# Patient Record
Sex: Male | Born: 1980
Health system: Southern US, Community
[De-identification: ages and names within clinical notes are randomized; demographics above are authoritative.]

## PROBLEM LIST (undated history)

## (undated) DIAGNOSIS — R002 Palpitations: Secondary | ICD-10-CM

## (undated) HISTORY — PX: OTHER SURGICAL HISTORY: SHX169

## (undated) HISTORY — DX: Palpitations: R00.2

## (undated) HISTORY — PX: ANTERIOR CRUCIATE LIGAMENT REPAIR: SHX115

---

## 2016-01-22 ENCOUNTER — Emergency Department (HOSPITAL_COMMUNITY): Payer: BLUE CROSS/BLUE SHIELD

## 2016-01-22 ENCOUNTER — Encounter (HOSPITAL_COMMUNITY): Payer: Self-pay | Admitting: Emergency Medicine

## 2016-01-22 ENCOUNTER — Emergency Department (HOSPITAL_COMMUNITY)
Admission: EM | Admit: 2016-01-22 | Discharge: 2016-01-22 | Disposition: A | Discharge status: Home / Self Care | Payer: BLUE CROSS/BLUE SHIELD | Attending: Emergency Medicine | Admitting: Emergency Medicine

## 2016-01-22 DIAGNOSIS — R42 Dizziness and giddiness: Secondary | ICD-10-CM | POA: Diagnosis not present

## 2016-01-22 DIAGNOSIS — R079 Chest pain, unspecified: Secondary | ICD-10-CM | POA: Diagnosis not present

## 2016-01-22 DIAGNOSIS — Z87891 Personal history of nicotine dependence: Secondary | ICD-10-CM | POA: Insufficient documentation

## 2016-01-22 DIAGNOSIS — R0789 Other chest pain: Secondary | ICD-10-CM | POA: Diagnosis not present

## 2016-01-22 LAB — I-STAT CHEM 8, ED
BUN: 10 mg/dL (ref 6–20)
Calcium, Ion: 1.12 mmol/L — ABNORMAL LOW (ref 1.15–1.40)
Chloride: 100 mmol/L — ABNORMAL LOW (ref 101–111)
Creatinine, Ser: 0.9 mg/dL (ref 0.61–1.24)
Glucose, Bld: 144 mg/dL — ABNORMAL HIGH (ref 65–99)
HCT: 41 % (ref 39.0–52.0)
Hemoglobin: 13.9 g/dL (ref 13.0–17.0)
Potassium: 3.8 mmol/L (ref 3.5–5.1)
Sodium: 140 mmol/L (ref 135–145)
TCO2: 27 mmol/L (ref 0–100)

## 2016-01-22 LAB — I-STAT TROPONIN, ED
Troponin i, poc: 0 ng/mL (ref 0.00–0.08)
Troponin i, poc: 0 ng/mL (ref 0.00–0.08)

## 2016-01-22 NOTE — ED Triage Notes (Addendum)
Per [facility], Pt c/o left chest pain onset this am and has been off and on all day.  EMS gave pt NTG SL x's 2 with relief of pain. Pt was also given ASA 324mg .  Pt denies any shortness of breath, diaphoresis or nausea.

## 2016-01-22 NOTE — ED Notes (Signed)
Pt reports having upper chest pain that started this am. Pt states his chest feels heavy and it is hard to breathe. Pt reports dizziness, light-headedness and also  numbness to his Left arm. Pt states his pain is not really there now rating 1/10.

## 2016-01-22 NOTE — ED Provider Notes (Signed)
[facility]EMER[facility]ENCY DEPT Provider Note   CSN: [id number] Arrival date & time: 01/22/16  1542     History   Chief Complaint Chief Complaint  Patient presents with  . Chest Pain    HPI Bonnye Favaiman Budden is a 35 y.o. male with a PMH of obesity presenting to the ED with chest pain that started this afternoon. He states that over the last ten days, he has noticed that his heart has been intermittently "beating fast and hard" when he is laying in bed at night. This morning, he went for a walk before work and felt dizzy. He then ate breakfast and started feeling normal again. At 1:30PM this afternoon, he was walking at work when he started feeling dizzy again. He then noticed chest pain and feeling like his heart was beating hard. The pain is located across his entire chest. The pain "feels like someone is sitting on his chest". The pain does not radiate. Nothing makes the pain worse. En route to the ED, he was given Nitroglycerin x 2 and ASA 324mg , which stopped his chest pain. He endorses shortness of breath that occurred with the chest pain. He denies any nausea, vomiting, or diaphoresis. He has not been under stress recently. He has never had a panic attack before. He has not had any recent surgeries or prolonged car rides.  HPI  History reviewed. No pertinent past medical history.  There are no active problems to display for this patient.   History reviewed. No pertinent surgical history.    Home Medications    Prior to Admission medications   Medication Sig Start Date End Date Taking? Authorizing Provider  ibuprofen (ADVIL,MOTRIN) 200 MG tablet Take 200 mg by mouth every 6 (six) hours as needed for moderate pain.   Yes Historical Provider, MD    Family History No family history of DVT/PE or MI at an early age.  Social History Social History  Substance Use Topics  . Smoking status: Former Games developermoker  . Smokeless tobacco: Never Used  . Alcohol use No     Allergies   Patient has no  known allergies.   Review of Systems Review of Systems 10 Systems reviewed and are negative for acute change except as noted in the HPI.  Physical Exam Updated Vital Signs BP 117/85   Pulse 79   Temp 98.7 F (37.1 C) (Oral)   Resp 15   Ht 5\' 8"  (1.727 m)   Wt 97.5 kg   SpO2 100%   BMI 32.69 kg/m   Physical Exam  Constitutional: He is oriented to person, place, and time. He appears well-developed and well-nourished.  HENT:  Head: Normocephalic and atraumatic.  Mouth/Throat: Oropharynx is clear and moist.  Eyes: Conjunctivae are normal.  Neck: Neck supple. No JVD present. No thyromegaly present.  Cardiovascular: Normal rate and regular rhythm.   No murmur heard. Pulmonary/Chest: Effort normal and breath sounds normal. No respiratory distress. He has no wheezes. He exhibits tenderness.  Abdominal: Soft. Bowel sounds are normal. He exhibits no distension. There is no tenderness. There is no rebound and no guarding.  Musculoskeletal: Normal range of motion. He exhibits no edema.  Neurological: He is alert and oriented to person, place, and time. No cranial nerve deficit.  Skin: Skin is warm and dry. Capillary refill takes less than 2 seconds.  Psychiatric: He has a normal mood and affect. His behavior is normal. Thought content normal.  Nursing note and vitals reviewed.    ED Treatments / Results  Labs (all labs ordered are listed, but only abnormal results are displayed) Labs Reviewed  I-STAT CHEM 8, ED - Abnormal; Notable for the following:       Result Value   Chloride 100 (*)    Glucose, Bld 144 (*)    Calcium, Ion 1.12 (*)    All other components within normal limits  Rosezena SensorI-STAT TROPOININ, ED  Rosezena SensorI-STAT TROPOININ, ED    EKG  EKG Interpretation  Date/Time:  Wednesday January 22 2016 15:55:14 EST Ventricular Rate:  87 PR Interval:    QRS Duration: 91 QT Interval:  361 QTC Calculation: 435 R Axis:   69 Text Interpretation:  Sinus rhythm No old tracing to compare  Confirmed by [facility]  MD, ELLIOTT [phone]([id number]) on 01/22/2016 4:02:17 PM       Radiology Dg Chest 2 View  Result Date: 01/22/2016 CLINICAL DATA:  Lightheadedness, LEFT arm numbness EXAM: CHEST  2 VIEW COMPARISON:  None. FINDINGS: Normal mediastinum and cardiac silhouette. Normal pulmonary vasculature. No evidence of effusion, infiltrate, or pneumothorax. No acute bony abnormality. Low lung volumes IMPRESSION: Low lung volumes.  No acute cardiopulmonary process. Electronically Signed   By: Genevive BiStewart  Edmunds M.D.   On: 01/22/2016 17:23    Procedures Procedures (including critical care time)  Medications Ordered in ED Medications - No data to display   Initial Impression / Assessment and Plan / ED Course  I have reviewed the triage vital signs and the nursing notes.  Pertinent labs & imaging results that were available during my care of the patient were reviewed by me and considered in my medical decision making (see chart for details).  Clinical Course    [name] is a 35 year old male with a PMH of obesity who presents to the ED with chest pain. He does have some typical features including chest pain that resolved with nitroglycerin and chest pain associated with dizziness and shortness of breath. He also has some atypical features, including chest pain that is reproducible on palpation of the sternum. Think ACS is unlikely in this 35 year old otherwise healthy male. He does not have a family history of heart attacks at an early age. He does have some risk factors including obesity. Heart score is a 2. Think this is more likely musculoskeletal (given that his chest pain is reproducible on palpation of the sternum) vs panic attack. Do not think this is a PE, as Pt does not have any risk factors and is PERC negative. Will get basic lab work and imaging including I-stat chem 8, I-stat trop, EKG, and CXR.  4:15PM: EKG with normal sinus rhythm.  5:30PM: I-stat trop 0.00, I-stat chem 8 unremarkable except  for glucose of 144, CXR negative. Will obtain another troponin, because his chest pain started at 1:30PM.  6:30PM: Repeat I-stat trop was 0.00. Pt is safe for discharge home.  Final Clinical Impressions(s) / ED Diagnoses   Final diagnoses:  Chest wall pain    New Prescriptions Current Discharge Medication List       Campbell StallKaty Dodd Mayo, MD 01/22/16 1830    Tilden FossaElizabeth Rees, MD 01/24/16 [phone]

## 2016-02-06 ENCOUNTER — Ambulatory Visit (INDEPENDENT_AMBULATORY_CARE_PROVIDER_SITE_OTHER): Payer: BLUE CROSS/BLUE SHIELD | Admitting: Family Medicine

## 2016-02-06 ENCOUNTER — Encounter: Payer: Self-pay | Admitting: Family Medicine

## 2016-02-06 VITALS — BP 124/82 | HR 85 | Temp 98.4°F | Resp 12 | Wt 217.9 lb

## 2016-02-06 DIAGNOSIS — R0789 Other chest pain: Secondary | ICD-10-CM | POA: Diagnosis not present

## 2016-02-06 DIAGNOSIS — Z6833 Body mass index (BMI) 33.0-33.9, adult: Secondary | ICD-10-CM | POA: Diagnosis not present

## 2016-02-06 DIAGNOSIS — R739 Hyperglycemia, unspecified: Secondary | ICD-10-CM

## 2016-02-06 DIAGNOSIS — R002 Palpitations: Secondary | ICD-10-CM | POA: Diagnosis not present

## 2016-02-06 NOTE — Progress Notes (Signed)
HPI:   [name] is a 36 y.o. male, who is here today to establish care with me.  Former PCP: N/A, he was following with provider at school. Last preventive routine visit: 1-2 years ago.   Concerns today: ER follow up.  He presented to the ER on 01/22/16 due to chest pain and LUE numbness. Work-up otherwise negative. He takes Ibuprofen as needed.   Lab Results  Component Value Date   CREATININE 0.90 01/22/2016   BUN 10 01/22/2016   NA 140 01/22/2016   K 3.8 01/22/2016   CL 100 (L) 01/22/2016   Glucose 144, not fating, no Hx of diabetes.   Pain has improved, almost resolved. Numbness resolved.  It is not exacerbated by exertion but when lying down, felt like he could not breath deep. He was feeling some palpitations, also at rest, and resolved.  + Pressure like, constant, no associated dyspnea,or diaphoresis.  He has not noted fever,chills, or skin rash. No Hx of trauma. He denies Hx of anxiety.    CXR : Low volumes, negative otherwise.  He usually exercises regularly, has not done so since ER visit. He denies chest pain or dyspnea with exercise. He is trying to follow a healthy diet and has noted some wt loss.    Review of Systems  Constitutional: Negative for activity change, appetite change, fatigue, fever and unexpected weight change.  HENT: Negative for mouth sores, nosebleeds, sore throat and trouble swallowing.   Respiratory: Negative for cough, shortness of breath and wheezing.   Cardiovascular: Negative for palpitations and leg swelling.  Gastrointestinal: Negative for abdominal pain, nausea and vomiting.  Genitourinary: Negative for decreased urine volume and hematuria.  Musculoskeletal: Negative for back pain and myalgias.  Skin: Negative for rash.  Neurological: Negative for syncope, weakness, numbness and headaches.  Hematological: Negative for adenopathy. Does not bruise/bleed easily.  Psychiatric/Behavioral: Negative for  confusion. The patient is not nervous/anxious.       Current Outpatient Prescriptions on File Prior to Visit  Medication Sig Dispense Refill  . ibuprofen (ADVIL,MOTRIN) 200 MG tablet Take 200 mg by mouth every 6 (six) hours as needed for moderate pain.     No current facility-administered medications on file prior to visit.      Past Medical History:  Diagnosis Date  . Allergy    No Known Allergies  Family History  Problem Relation Age of Onset  . Cancer Mother   . Heart disease Maternal Aunt   . Hypertension Maternal Aunt   . Diabetes Paternal Grandfather     Social History   Social History  . Marital status: Single    Spouse name: N/A  . Number of children: N/A  . Years of education: N/A   Social History Main Topics  . Smoking status: Former Games developermoker  . Smokeless tobacco: Never Used  . Alcohol use No  . Drug use: No  . Sexual activity: Yes   Other Topics Concern  . None   Social History Narrative  . None    Vitals:   02/06/16 1400  BP: 124/82  Pulse: 85  Resp: 12  Temp: 98.4 F (36.9 C)   O2 sat 98% at RA.  Body mass index is 33.13 kg/m.   Physical Exam  Nursing note and vitals reviewed. Constitutional: He is oriented to person, place, and time. He appears well-developed. No distress.  HENT:  Head: Atraumatic.  Mouth/Throat: Oropharynx is clear and moist and mucous membranes are normal.  Eyes: Conjunctivae and EOM are normal. Pupils are equal, round, and reactive to light.  Neck: No thyroid mass and no thyromegaly present.  Cardiovascular: Normal rate and regular rhythm.   No murmur heard. Pulses:      Dorsalis pedis pulses are 2+ on the right side, and 2+ on the left side.  Respiratory: Effort normal and breath sounds normal. No respiratory distress. He exhibits no tenderness.  GI: Soft. He exhibits no mass. There is no hepatomegaly. There is no tenderness.  Musculoskeletal: He exhibits no edema.  Lymphadenopathy:    He has no cervical  adenopathy.  Neurological: He is alert and oriented to person, place, and time. He has normal strength. Coordination and gait normal.  Skin: Skin is warm. No erythema.  Psychiatric: He has a normal mood and affect.  Well groomed, good eye contact.      ASSESSMENT AND PLAN:     Dave Luna was seen today for new patient (initial visit).  Diagnoses and all orders for this visit:   Lab Results  Component Value Date   TSH 1.53 02/07/2016   Lab Results  Component Value Date   CREATININE 0.99 02/07/2016   BUN 14 02/07/2016   NA 138 02/07/2016   K 4.3 02/07/2016   CL 102 02/07/2016   CO2 29 02/07/2016    Chest wall pain  Improved. Possible causes discussed. Instructed about warning signs. Continue Ibuprofen OTC as needed.  Hyperglycemia  Further recommendations will be given according to lab results.   -     Basic metabolic panel; Future  Palpitations  Resolved. Instructed about warning signs. further recommendations will be given according to lab results.  -     TSH; Future   BMI 33.0-33.9,adult  We discussed benefits of wt loss as well as adverse effects of obesity. Consistency with healthy diet and physical activity recommended.   -He is not fasting today, so will come back tomorrow morning for lab work.   Dave Luna G. [place], MD  [facility]. [place] office.

## 2016-02-06 NOTE — Patient Instructions (Signed)
A few things to remember from today's visit:   Hyperglycemia - Plan: Basic metabolic panel  Palpitations - Plan: TSH  Chest wall pain   We have ordered labs or studies at this visit.  It can take up to 1-2 weeks for results and processing. IF results require follow up or explanation, we will call you with instructions. Clinically stable results will be released to your [facility]. If you have not heard from us or cannot find your results in [facility] in 2 weeks please contact our office at [phone].  If you are not yet signed up for [facility], please consider signing up   Please be sure medication list is accurate. If a new problem present, please set up appointment sooner than planned today.

## 2016-02-06 NOTE — Progress Notes (Signed)
Pre visit review using our clinic review tool, if applicable. No additional management support is needed unless otherwise documented below in the visit note. 

## 2016-02-07 ENCOUNTER — Other Ambulatory Visit (INDEPENDENT_AMBULATORY_CARE_PROVIDER_SITE_OTHER): Payer: BLUE CROSS/BLUE SHIELD

## 2016-02-07 ENCOUNTER — Encounter: Payer: Self-pay | Admitting: Family Medicine

## 2016-02-07 DIAGNOSIS — R002 Palpitations: Secondary | ICD-10-CM | POA: Diagnosis not present

## 2016-02-07 DIAGNOSIS — R739 Hyperglycemia, unspecified: Secondary | ICD-10-CM | POA: Diagnosis not present

## 2016-02-07 LAB — TSH: TSH: 1.53 u[IU]/mL (ref 0.35–4.50)

## 2016-02-07 LAB — BASIC METABOLIC PANEL
BUN: 14 mg/dL (ref 6–23)
CO2: 29 mEq/L (ref 19–32)
Calcium: 10 mg/dL (ref 8.4–10.5)
Chloride: 102 mEq/L (ref 96–112)
Creatinine, Ser: 0.99 mg/dL (ref 0.40–1.50)
GFR: 91.74 mL/min (ref >60.00)
Glucose, Bld: 95 mg/dL (ref 70–99)
Potassium: 4.3 mEq/L (ref 3.5–5.1)
Sodium: 138 mEq/L (ref 135–145)

## 2016-02-17 ENCOUNTER — Ambulatory Visit (INDEPENDENT_AMBULATORY_CARE_PROVIDER_SITE_OTHER)
Admission: RE | Admit: 2016-02-17 | Discharge: 2016-02-17 | Disposition: A | Discharge status: Home / Self Care | Payer: BLUE CROSS/BLUE SHIELD | Source: Ambulatory Visit | Attending: Family Medicine | Admitting: Family Medicine

## 2016-02-17 ENCOUNTER — Encounter: Payer: Self-pay | Admitting: Family Medicine

## 2016-02-17 ENCOUNTER — Ambulatory Visit (INDEPENDENT_AMBULATORY_CARE_PROVIDER_SITE_OTHER): Payer: BLUE CROSS/BLUE SHIELD | Admitting: Family Medicine

## 2016-02-17 ENCOUNTER — Telehealth: Payer: Self-pay | Admitting: Family Medicine

## 2016-02-17 VITALS — BP 116/80 | HR 110 | Resp 12 | Ht 68.0 in | Wt 215.4 lb

## 2016-02-17 DIAGNOSIS — R0789 Other chest pain: Secondary | ICD-10-CM

## 2016-02-17 DIAGNOSIS — R000 Tachycardia, unspecified: Secondary | ICD-10-CM

## 2016-02-17 DIAGNOSIS — R0600 Dyspnea, unspecified: Secondary | ICD-10-CM

## 2016-02-17 DIAGNOSIS — R079 Chest pain, unspecified: Secondary | ICD-10-CM | POA: Diagnosis not present

## 2016-02-17 MED ORDER — IOPAMIDOL (ISOVUE-370) INJECTION 76%
80.0000 mL | Freq: Once | INTRAVENOUS | Status: AC | PRN
  Administered 2016-02-17: 80 mL via INTRAVENOUS

## 2016-02-17 NOTE — Telephone Encounter (Signed)
TELEPHONE ADVICE RECORD [facility]  Patient Name: Dave Luna  DOB: 04/24/1980    Initial Comment Caller states her nephew is having a fast heartbeat.    Nurse Assessment  Nurse: Odis LusterBowers, RN, Bjorn Loserhonda Date/Time Lamount Cohen(Eastern Time): 02/17/2016 1:45:34 PM  Confirm and document reason for call. If symptomatic, describe symptoms. ---Caller states her nephew is having a fast heartbeat. Caller reports that they are currently at the [facility] to be seen.  Does the patient have any new or worsening symptoms? ---Yes  Will a triage be completed? ---No  Select reason for no triage. ---Other  Please document clinical information provided and list any resource used. ---At [facility] office currently.     Guidelines    Guideline Title Affirmed Question Affirmed Notes       Final Disposition User   Clinical Call Odis LusterBowers, RN, Bjorn Loserhonda

## 2016-02-17 NOTE — Patient Instructions (Signed)
A few things to remember from today's visit:   Other chest pain - Plan: EKG 12-Lead, CT Angio Chest W/Cm &/Or Wo Cm  Dyspnea, unspecified type - Plan: CT Angio Chest W/Cm &/Or Wo Cm  I still feel like it can be muscle related but because sudden onset and recurrent CT is being arranged.   Please be sure medication list is accurate. If a new problem present, please set up appointment sooner than planned today.

## 2016-02-17 NOTE — Progress Notes (Signed)
HPI:  ACUTE VISIT:  Chief Complaint  Patient presents with  . chest wall pain    [name] is a 36 y.o. male, who is here today with his aunt complaining of chest pain. I saw him last on 02/06/16 to follow on recent ER visit for chest pain.  CXR otherwise negative,low lung volumes. Troponin x 2 and EKG negative for acute coronary event. According to pt, he has had 2 episodes of chest pain since his last OV, first one mild, lasted about 2 hours, states that it was relieved by taking Nitroglycerin SL. A second event today morning, started feeling his heart racing, "heavy breathing", and lightheaded then upper chest pain that starts on left shoulder and radiates across chest to right shoulder.Pain is severe, no diaphoresis associated. Pain has improved after taking Ibuprofen a couple hours ago and applying local ice.  According to aunt, she noted dyspnea while she was on the phone with him.  He denies any fever,chills, cough, wheezing, recent travel, or sick contact. No heartburn,abdominal pain, nausea, or vomiting.  He has not identified exacerbating factors. Sometimes chest pain is alleviated by walking outdoors.  He is exercising regularly and denies any chest pain or palpitation while doing so.  He denies anxiety or increased stress. No LE edema or erythema.  Former smoker.  FHx negative for premature CAD.   Review of Systems  Constitutional: Negative for activity change, appetite change, diaphoresis, fatigue and fever.  HENT: Negative for congestion, nosebleeds, sore throat, trouble swallowing and voice change.   Eyes: Negative for pain and visual disturbance.  Respiratory: Positive for shortness of breath. Negative for cough and wheezing.   Cardiovascular: Positive for chest pain and palpitations. Negative for leg swelling.  Gastrointestinal: Negative for abdominal pain, nausea and vomiting.       No changes in bowel habits.  Musculoskeletal: Negative for  back pain and myalgias.  Skin: Negative for color change and rash.  Neurological: Negative for dizziness, syncope, weakness and headaches.  Psychiatric/Behavioral: Negative for confusion and sleep disturbance. The patient is not nervous/anxious.       Current Outpatient Prescriptions on File Prior to Visit  Medication Sig Dispense Refill  . ibuprofen (ADVIL,MOTRIN) 200 MG tablet Take 200 mg by mouth every 6 (six) hours as needed for moderate pain.     No current facility-administered medications on file prior to visit.      Past Medical History:  Diagnosis Date  . Allergy    No Known Allergies  Social History   Social History  . Marital status: Single    Spouse name: N/A  . Number of children: N/A  . Years of education: N/A   Social History Main Topics  . Smoking status: Former Games developer  . Smokeless tobacco: Never Used  . Alcohol use No  . Drug use: No  . Sexual activity: Yes   Other Topics Concern  . None   Social History Narrative  . None    Vitals:   02/17/16 1448  BP: 116/80  Pulse: (!) 110  Resp: 12   O2 sat 97% at RA.  Body mass index is 32.75 kg/m.   Physical Exam  Nursing note and vitals reviewed. Constitutional: He is oriented to person, place, and time. He appears well-developed. No distress.  HENT:  Head: Atraumatic.  Mouth/Throat: Oropharynx is clear and moist and mucous membranes are normal.  Eyes: Conjunctivae and EOM are normal. Pupils are equal, round, and reactive to light.  Neck:  Normal range of motion. No thyroid mass present.  Cardiovascular: Regular rhythm.  Tachycardia present.   No murmur heard. Pulses:      Dorsalis pedis pulses are 2+ on the right side, and 2+ on the left side.  Respiratory: Effort normal and breath sounds normal. No respiratory distress. He exhibits tenderness (mild, bilateral accross upper chest).  GI: Soft. He exhibits no mass. There is no hepatomegaly. There is no tenderness.  Musculoskeletal: He  exhibits no edema or tenderness.       Cervical back: He exhibits no tenderness.       Thoracic back: He exhibits no tenderness.  Lymphadenopathy:    He has no cervical adenopathy.       Right: No supraclavicular adenopathy present.       Left: No supraclavicular adenopathy present.  Neurological: He is alert and oriented to person, place, and time. He has normal strength. Coordination and gait normal.  Skin: Skin is warm. No rash noted. No erythema.  Psychiatric: He has a normal mood and affect. Cognition and memory are normal.  Well groomed, good eye contact.      ASSESSMENT AND PLAN:     Dave Luna was seen today for chest wall pain.  Diagnoses and all orders for this visit:  Other chest pain  We discussed possible causes:cardiac,musculoskeletal,pulmonary. Because reporting sudden onset of upper chest pain, bilateral, associated with dyspnea I recommended chest CTA stat to evaluate for thrombotic event, PE. EKG today SR, normal axis, no acute ischemic changes. No significant changes when compare with the one done 01/23/16.  Even though clinical presentation does not suggest cardiac etiology, cardiology referral can be also considered. Clearly instructed about warning signs.  -     EKG 12-Lead -     CT Angio Chest W/Cm &/Or Wo Cm; Future  Dyspnea, unspecified type  Not exertional and associated with above problem. CXR 12/2015 negative.   -     CT Angio Chest W/Cm &/Or Wo Cm; Future  Sinus tachycardia  Mild. C/O intermittent palpitations at rest.  Lab Results  Component Value Date   TSH 1.53 02/07/2016   Cardiology evaluation to be consider if imaging today is negative and symptoms persist.  ? Anxiety.      -[name] was advised to return or notify a doctor immediately if symptoms worsen or persist or new concerns arise. Further recommendations will be given after chest CT report.       Betty G. [place], MD  [facility]. [place]  office.

## 2016-02-17 NOTE — Progress Notes (Signed)
Pre visit review using our clinic review tool, if applicable. No additional management support is needed unless otherwise documented below in the visit note. 

## 2016-02-17 NOTE — Telephone Encounter (Signed)
Pt is in office currently and waiting to be seen by Dr SwazilandJordan.

## 2016-02-27 ENCOUNTER — Emergency Department (HOSPITAL_COMMUNITY)
Admission: EM | Admit: 2016-02-27 | Discharge: 2016-02-28 | Disposition: A | Discharge status: Home / Self Care | Payer: BLUE CROSS/BLUE SHIELD | Attending: Emergency Medicine | Admitting: Emergency Medicine

## 2016-02-27 ENCOUNTER — Encounter (HOSPITAL_COMMUNITY): Payer: Self-pay | Admitting: Emergency Medicine

## 2016-02-27 ENCOUNTER — Emergency Department (HOSPITAL_COMMUNITY): Payer: BLUE CROSS/BLUE SHIELD

## 2016-02-27 ENCOUNTER — Ambulatory Visit (INDEPENDENT_AMBULATORY_CARE_PROVIDER_SITE_OTHER): Payer: BLUE CROSS/BLUE SHIELD | Admitting: Family Medicine

## 2016-02-27 ENCOUNTER — Encounter: Payer: Self-pay | Admitting: Family Medicine

## 2016-02-27 VITALS — BP 122/82 | HR 80 | Resp 12 | Ht 68.0 in | Wt 214.0 lb

## 2016-02-27 DIAGNOSIS — R42 Dizziness and giddiness: Secondary | ICD-10-CM | POA: Diagnosis present

## 2016-02-27 DIAGNOSIS — R002 Palpitations: Secondary | ICD-10-CM

## 2016-02-27 DIAGNOSIS — R000 Tachycardia, unspecified: Secondary | ICD-10-CM | POA: Diagnosis not present

## 2016-02-27 DIAGNOSIS — J9811 Atelectasis: Secondary | ICD-10-CM | POA: Diagnosis not present

## 2016-02-27 DIAGNOSIS — R55 Syncope and collapse: Secondary | ICD-10-CM | POA: Insufficient documentation

## 2016-02-27 DIAGNOSIS — Z87891 Personal history of nicotine dependence: Secondary | ICD-10-CM | POA: Diagnosis not present

## 2016-02-27 LAB — CBC
HCT: 42.3 % (ref 39.0–52.0)
Hemoglobin: 13.7 g/dL (ref 13.0–17.0)
MCH: 25 pg — ABNORMAL LOW (ref 26.0–34.0)
MCHC: 32.4 g/dL (ref 30.0–36.0)
MCV: 77.2 fL — ABNORMAL LOW (ref 78.0–100.0)
Platelets: 300 10*3/uL (ref 150–400)
RBC: 5.48 MIL/uL (ref 4.22–5.81)
RDW: 13.7 % (ref 11.5–15.5)
WBC: 6.9 10*3/uL (ref 4.0–10.5)

## 2016-02-27 LAB — URINALYSIS, ROUTINE W REFLEX MICROSCOPIC
Bilirubin Urine: NEGATIVE
Glucose, UA: NEGATIVE mg/dL
Hgb urine dipstick: NEGATIVE
Ketones, ur: NEGATIVE mg/dL
Leukocytes, UA: NEGATIVE
Nitrite: NEGATIVE
Protein, ur: NEGATIVE mg/dL
Specific Gravity, Urine: 1.015 (ref 1.005–1.030)
pH: 6 (ref 5.0–8.0)

## 2016-02-27 LAB — CBG MONITORING, ED
Glucose-Capillary: 146 mg/dL — ABNORMAL HIGH (ref 65–99)
Glucose-Capillary: 80 mg/dL (ref 65–99)

## 2016-02-27 LAB — BASIC METABOLIC PANEL
Anion gap: 8 (ref 5–15)
BUN: 17 mg/dL (ref 6–20)
CO2: 28 mmol/L (ref 22–32)
Calcium: 9.5 mg/dL (ref 8.9–10.3)
Chloride: 103 mmol/L (ref 101–111)
Creatinine, Ser: 0.96 mg/dL (ref 0.61–1.24)
GFR calc Af Amer: >60 mL/min (ref >60)
GFR calc non Af Amer: >60 mL/min (ref >60)
Glucose, Bld: 90 mg/dL (ref 65–99)
Potassium: 4 mmol/L (ref 3.5–5.1)
Sodium: 139 mmol/L (ref 135–145)

## 2016-02-27 LAB — I-STAT TROPONIN, ED: Troponin i, poc: 0 ng/mL (ref 0.00–0.08)

## 2016-02-27 MED ORDER — SODIUM CHLORIDE 0.9 % IV BOLUS (SEPSIS): 1000.0000 mL | Freq: Once | INTRAVENOUS | Status: DC

## 2016-02-27 NOTE — Discharge Instructions (Signed)
Get help right away if: °You have a severe headache. °You have unusual pain in your chest, abdomen, or back. °You are bleeding from your mouth or rectum, or you have black or tarry stool. °You have a very fast or irregular heartbeat (palpitations). °You faint once or repeatedly. °You have a seizure. °You are confused. °You have trouble walking. °You have severe weakness. °You have vision problems. °

## 2016-02-27 NOTE — Progress Notes (Signed)
Pre visit review using our clinic review tool, if applicable. No additional management support is needed unless otherwise documented below in the visit note. 

## 2016-02-27 NOTE — ED Provider Notes (Signed)
[facility]-EMERGENCY DEPT Provider Note   CSN: [id number] Arrival date & time: 02/27/16  0141     History   Chief Complaint Chief Complaint  Patient presents with  . Dizziness    HPI Dave Luna is a 36 y.o. male who presents with c/o presyncope. Patient has been seen in the ED previously (01/22/2016) with similar sxs. He has had intermittent episodes of feeling his heart racing and beating hard. This morning, he awoke from sleep around 1:00 AM with diaphoresis, blurry vision, pounding heart, and feeling that he was going to pass out. This episode lasted about 30 min. He denies vertiginous sxs. He was able to get up and Go to the kitchen to eat half a banana and drink some orange juice. He states that he thought maybe his blood sugar was low, although he has no history of diabetes. The patient states that his symptoms resolved. He denies nausea, chest pain,  Patient states that he felt weak in his legs when he walked, but denies unilateral weakness.  HPI  Past Medical History:  Diagnosis Date  . Allergy     Patient Active Problem List   Diagnosis Date Noted  . BMI 33.0-33.9,adult 02/06/2016    History reviewed. No pertinent surgical history.     Home Medications    Prior to Admission medications   Medication Sig Start Date End Date Taking? Authorizing Provider  ibuprofen (ADVIL,MOTRIN) 200 MG tablet Take 200 mg by mouth every 6 (six) hours as needed for moderate pain.   Yes Historical Provider, MD    Family History Family History  Problem Relation Age of Onset  . Cancer Mother   . Heart disease Maternal Aunt   . Hypertension Maternal Aunt   . Diabetes Paternal Grandfather     Social History Social History  Substance Use Topics  . Smoking status: Former Games developermoker  . Smokeless tobacco: Never Used  . Alcohol use No     Allergies   Patient has no known allergies.   Review of Systems Review of Systems Ten systems reviewed and are negative for acute change, except as  noted in the HPI.    Physical Exam Updated Vital Signs BP 104/68   Pulse 71   Temp 97.8 F (36.6 C) (Oral)   Resp 19   Ht 5\' 8"  (1.727 m)   Wt 96.6 kg   SpO2 98%   BMI 32.39 kg/m   Physical Exam Physical Exam  Nursing note and vitals reviewed. Constitutional: He appears well-developed and well-nourished. No distress.  HENT:  Head: Normocephalic and atraumatic.  Eyes: Conjunctivae normal are normal. No scleral icterus.  Neck: Normal range of motion. Neck supple.  Cardiovascular: Normal rate, regular rhythm and normal heart sounds.   Pulmonary/Chest: Effort normal and breath sounds normal. No respiratory distress.  Abdominal: Soft. There is no tenderness.  Musculoskeletal: He exhibits no edema.  Neurological: Speech is clear and goal oriented, follows commands Major Cranial nerves without deficit, no facial droop Normal strength in upper and lower extremities bilaterally including dorsiflexion and plantar flexion, strong and equal grip strength Sensation normal to light and sharp touch Moves extremities without ataxia, coordination intact Normal finger to nose and rapid alternating movements Neg romberg, no pronator drift Normal gait Normal heel-shin and balance Skin: Skin is warm and dry. He is not diaphoretic.  Psychiatric: His behavior is normal.     ED Treatments / Results  Labs (all labs ordered are listed, but only abnormal results are displayed) Labs Reviewed  CBC - Abnormal; Notable for the following:       Result Value   MCV 77.2 (*)    MCH 25.0 (*)    All other components within normal limits  URINALYSIS, ROUTINE W REFLEX MICROSCOPIC - Abnormal; Notable for the following:    Color, Urine STRAW (*)    All other components within normal limits  CBG MONITORING, ED - Abnormal; Notable for the following:    Glucose-Capillary 146 (*)    All other components within normal limits  BASIC METABOLIC PANEL  CBG MONITORING, ED  POCT CBG (FASTING - GLUCOSE)-MANUAL  ENTRY  I-STAT TROPOININ, ED    EKG  EKG Interpretation  Date/Time:  Thursday February 27 2016 04:06:08 EST Ventricular Rate:  71 PR Interval:    QRS Duration: 92 QT Interval:  376 QTC Calculation: 409 R Axis:   53 Text Interpretation:  Sinus rhythm ST elev, probable normal early repol pattern When compared with ECG of EARLIER SAME DATE No significant change was found Confirmed by [facility]  MD, DAVID ([id number]) on 02/27/2016 4:18:18 AM       Radiology Dg Chest 2 View  Result Date: 02/27/2016 CLINICAL DATA:  Intermittent dizziness and lightheadedness for 3 days. EXAM: CHEST  2 VIEW COMPARISON:  CT chest February 17, 2016 FINDINGS: Cardiomediastinal silhouette is normal. No pleural effusions or focal consolidations. Strandy densities LEFT lung base. Trachea projects midline and there is no pneumothorax. Soft tissue planes and included osseous structures are non-suspicious. IMPRESSION: LEFT lung base atelectasis. Electronically Signed   By: Awilda Metro M.D.   On: 02/27/2016 04:15    Procedures Procedures (including critical care time)  Medications Ordered in ED Medications  sodium chloride 0.9 % bolus 1,000 mL (not administered)     Initial Impression / Assessment and Plan / ED Course  I have reviewed the triage vital signs and the nursing notes.  Pertinent labs & imaging results that were available during my care of the patient were reviewed by me and considered in my medical decision making (see chart for details).     Patient with normal ECG. His blood pressures are soft and will give fluids. He has had negative orthostatics. Chest x-ray without significant abnormality. Patient's labs are reassuring. I doubt TIA or other neurologic symptom. I do have concern for an intermittent cardiac arrhythmia. Patient will receive fluids. Feel he is safe to be discharged with outpatient follow-up for Holter monitoring.  Final Clinical Impressions(s) / ED Diagnoses   Final diagnoses:  None      New Prescriptions New Prescriptions   No medications on file     Arthor Captain, PA-C 03/02/16 1554    Dione Booze, MD 03/03/16 [phone]

## 2016-02-27 NOTE — ED Notes (Signed)
Patient d/c;d self care.  F/U  And medications reviewed.  Patient verbalized undertanding.

## 2016-02-27 NOTE — Patient Instructions (Signed)
A few things to remember from today's visit:   Pre-syncope - Plan: Glucose tolerance, 4 hours  Heart palpitations - Plan: Ambulatory referral to Cardiology  Eat small portions of food q 3 hours. Adequate hydration.  At this point I am not certain about what is causing symptoms. Labs and imaging have been otherwise normal.  Test for sugar ordered and cardiology appointment will be arranged.   Please be sure medication list is accurate. If a new problem present, please set up appointment sooner than planned today.

## 2016-02-27 NOTE — ED Notes (Signed)
Pt states that he had an episode of dizziness on Sunday and flt as though he would pass out, and had a similar dizzy spell tonight in where he awoke and was sweating and had non radiating  chest discomfort/palpiations. Chest discomfort lasted for about an hour,and was accompanied by diaphoresis,  SOB, and nausea. Pt states that he did not take any medication for pain. Pt also reports that he was seen by his MD on 12/27 for similar chest discomfort and was dx with Chest wall pain EKG/CT scan were normal as per patient. Pt is accompanied  With Aunt and Cousin. Pt appears calm and is without distress at this time.

## 2016-02-27 NOTE — Progress Notes (Signed)
HPI:   ACUTE VISIT:  No chief complaint on file.   Dave Luna is a 36 y.o. male, who is here today complaining of episodes of palpitations and rapid heart beat, he thinks are caused by low blood sugars. Dave Dave Luna came to the room 10 min into Dave visit, he was agreeable for him to be with him.  I saw him last 02/06/16 to follow on ER visit due to chest pain, chest CT was ordered because persistent symptoms and negative otherwise.    These symptoms started 4 days ago:  On 02/23/16 Woke up feeling clammy, having palpitation, "shaking", and feeling dizzy.  He assumed it was Dave BS,so ate a banana and drank  Juice,15 min later he started feeling better and an hour to be back to Dave baseline.  Today around 1 am woke again sweating ,associated palpitation, legs feeling weak, could not get up and walk, very fatigue. He states that he thought he was going to faint. Dave Dave Luna took him to the ER.  He felt better after drinking juice. Palpitations lasted longer this time.  He has no associated headache during acute episode but has had headache at the end of the day, attributed to poor sleep.  He has been exercising regularly, runs and walks, denies any similar symptom while doing so. No chest pain or dyspnea. He is following a healthy diet, he is not skipping meals. He ate dinner around 6-7 pm the day before these episodes happened. 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  Dave Luna was in the ER earlier today and some labs were done as well as EKG and CXR.  Lab Results  Component Value Date   CREATININE 0.96 02/27/2016   BUN 17 02/27/2016   NA 139 02/27/2016   K 4.0 02/27/2016   CL 103 02/27/2016   CO2 28 02/27/2016   Lab Results  Component Value Date   WBC 6.9 02/27/2016   HGB 13.7 02/27/2016   HCT 42.3 02/27/2016   MCV 77.2 (L) 02/27/2016   PLT 300 02/27/2016    Troponin x 1 WNL. Glucose upon arrival 146, 1-2 hours later 80.  He denies any new stress or feeling anxious.   Review of Systems  Constitutional: Positive for diaphoresis and fatigue. Negative for activity change, appetite change, chills, fever and unexpected weight change.  HENT: Negative for congestion, mouth sores, nosebleeds, sore throat, trouble swallowing and voice change.   Eyes: Negative for pain and visual disturbance.  Respiratory: Negative for apnea, cough, shortness of breath and wheezing.   Cardiovascular: Positive for palpitations. Negative for chest pain and leg swelling.  Gastrointestinal: Negative for abdominal pain, nausea and vomiting.       No changes in bowel habits.  Endocrine: Negative for cold intolerance, heat intolerance, polydipsia, polyphagia and polyuria.  Genitourinary: Negative for decreased urine volume, dysuria, frequency and hematuria.  Musculoskeletal: Negative for back pain, myalgias and neck pain.  Skin: Negative for rash.  Neurological: Positive for light-headedness. Negative for seizures, syncope, weakness, numbness and headaches.  Hematological: Negative for adenopathy. Does not bruise/bleed easily.  Psychiatric/Behavioral: Negative for confusion. The patient is not nervous/anxious.  Current Outpatient Prescriptions on File Prior to Visit  Medication Sig Dispense Refill  . ibuprofen (ADVIL,MOTRIN) 200 MG tablet Take 200 mg by mouth every 6 (six) hours as needed for moderate pain.     No current facility-administered medications on file prior to visit.      Past Medical History:  Diagnosis Date  . Allergy    No Known Allergies  Social History   Social History  . Marital status: Single    Spouse name: N/A  . Number of children: N/A  . Years of  education: N/A   Social History Main Topics  . Smoking status: Former Games developermoker  . Smokeless tobacco: Never Used  . Alcohol use No  . Drug use: No  . Sexual activity: Yes   Other Topics Concern  . None   Social History Narrative  . None    Vitals:   02/27/16 0816  BP: 122/82  Luna: 80  Resp: 12   O2 sat at RA 98%.  Body mass index is 32.54 kg/m.  Wt Readings from Last 3 Encounters:  02/27/16 213 lb (96.6 kg)  02/27/16 214 lb (97.1 kg)  02/17/16 215 lb 6 oz (97.7 kg)     Physical Exam  Nursing note and vitals reviewed. Constitutional: He is oriented to person, place, and time. He appears well-developed. No distress.  HENT:  Head: Atraumatic.  Mouth/Throat: Oropharynx is clear and moist and mucous membranes are normal.  Eyes: Conjunctivae and EOM are normal. Pupils are equal, round, and reactive to light.  Cardiovascular: Normal rate and regular rhythm.   No murmur heard. Pulses:      Dorsalis pedis pulses are 2+ on the right side, and 2+ on the left side.  Respiratory: Effort normal and breath sounds normal. No respiratory distress.  GI: Soft. He exhibits no mass. There is no hepatomegaly. There is no tenderness.  Musculoskeletal: He exhibits no edema or tenderness.  Lymphadenopathy:    He has no cervical adenopathy.  Neurological: He is alert and oriented to person, place, and time. He has normal strength. No cranial nerve deficit. Coordination and gait normal.  Skin: Skin is warm. No rash noted. No erythema.  Psychiatric: He has a normal mood and affect. Cognition and memory are normal.  Well groomed, good eye contact.      ASSESSMENT AND PLAN:     Diagnoses and all orders for this visit:  Pre-syncope -     Glucose tolerance, 4 hours; Future  Heart palpitations -     Ambulatory referral to Cardiology   Since he just had lab work and imaging/EKG done today in the ER I don't think further studies are needed today. He will come back to the lab  fasting to do a glucose tolerance test and evaluate for hypoglycemic event.  ? Arrhythmia. ? Anxiety ? OSA  Lab Results  Component Value Date   TSH 1.53 02/07/2016    Instructed to eat small meals frequently. Adequate hydration. Clearly instructed about warning signs. He may need cardiac work up, even monitor, so appointment with cardiology will be arranged.  Face to face from 8:22 am-8:57 am. > 50% of the time was dedicated to review labs and test done previously, possible etiologies, and discussion of warning signs.    Return in about 2 weeks (around 03/12/2016) for palpitation..   -Dave Luna was advised to return or notify a doctor immediately if symptoms worsen or new concerns arise.       Betty G. SwazilandJordan,  MD  [facility]. [place] office.

## 2016-02-27 NOTE — ED Triage Notes (Signed)
Pt comes with complaints of sudden onset of dizziness, diaphoresis, and blurry vision.  States he felt like his sugar was low and ate a banana and drank orange juice and he felt better.  Denies any hx of blood sugar issues.  Feels better on assessment. No complaints at this time.  A&O x4.  Ambulatory in triage.

## 2016-02-28 ENCOUNTER — Other Ambulatory Visit (INDEPENDENT_AMBULATORY_CARE_PROVIDER_SITE_OTHER): Payer: BLUE CROSS/BLUE SHIELD

## 2016-02-28 DIAGNOSIS — R55 Syncope and collapse: Secondary | ICD-10-CM

## 2016-02-28 LAB — GLUCOSE TOLERANCE, 4 HOURS
Glucose, 1 Hour GTT: 126 mg/dL
Glucose, 2 hour: 104 mg/dL
Glucose, 4 hour: 69 mg/dL
Glucose, Fasting: 93 mg/dL (ref 70–99)
Glucose, GTT - 3 Hour: 60 mg/dL

## 2016-02-29 ENCOUNTER — Encounter: Payer: Self-pay | Admitting: Family Medicine

## 2016-03-12 ENCOUNTER — Encounter: Payer: Self-pay | Admitting: Family Medicine

## 2016-03-12 ENCOUNTER — Ambulatory Visit (INDEPENDENT_AMBULATORY_CARE_PROVIDER_SITE_OTHER): Payer: BLUE CROSS/BLUE SHIELD | Admitting: Family Medicine

## 2016-03-12 VITALS — BP 130/78 | HR 89 | Resp 12 | Ht 68.0 in | Wt 212.4 lb

## 2016-03-12 DIAGNOSIS — R002 Palpitations: Secondary | ICD-10-CM

## 2016-03-12 NOTE — Progress Notes (Signed)
HPI:   Dave Luna is a 36 y.o. male, who is here today to follow on recent OV.   He was seen on 02/27/16 because pre syncopal episodes x 2.  Overall he is feeling better, has not had lightheaded episodes. 4 episodes of palpitations since 02/28/16 to 03/05/16, 2 of them intense and lasted over an hour.  He stopped tea consumption and did not notice major changes in palpitation frequency or intensity. 6 days ago he stopped coffee intake, has had 2 "minor" episodes of palpitations since then and lasted less than 5 minutes. He had mild headache for a couple days after discontinuing caffeine but better now.  He checked HR one time when symptomatic,HR was 88/min and regular.  His appointment with cardiologist is tomorrow,Dr Clifton James, and wonders if he still needs to keep appt.  No new concerns today.   Review of Systems  Constitutional: Negative for appetite change, fatigue, fever and unexpected weight change.  HENT: Negative for facial swelling, mouth sores, nosebleeds and trouble swallowing.   Respiratory: Negative for cough, shortness of breath and wheezing.   Cardiovascular: Negative for chest pain, palpitations and leg swelling.  Gastrointestinal: Negative for abdominal pain, nausea and vomiting.  Musculoskeletal: Negative for back pain, gait problem and myalgias.  Neurological: Negative for dizziness, seizures, syncope, weakness and numbness.  Psychiatric/Behavioral: Negative for confusion and sleep disturbance. The patient is not nervous/anxious.       Current Outpatient Prescriptions on File Prior to Visit  Medication Sig Dispense Refill  . ibuprofen (ADVIL,MOTRIN) 200 MG tablet Take 200 mg by mouth every 6 (six) hours as needed for moderate pain.     No current facility-administered medications on file prior to visit.      Past Medical History:  Diagnosis Date  . Allergy    No Known Allergies  Social History   Social History  . Marital status: Single   Spouse name: N/A  . Number of children: N/A  . Years of education: N/A   Social History Main Topics  . Smoking status: Former Games developer  . Smokeless tobacco: Never Used  . Alcohol use No  . Drug use: No  . Sexual activity: Yes   Other Topics Concern  . None   Social History Narrative  . None    Vitals:   03/12/16 1145  BP: 130/78  Pulse: 89  Resp: 12  O2 sat at RA 99%. Body mass index is 32.29 kg/m.   Physical Exam  Nursing note and vitals reviewed. Constitutional: He is oriented to person, place, and time. He appears well-developed. No distress.  HENT:  Head: Atraumatic.  Mouth/Throat: Uvula is midline, oropharynx is clear and moist and mucous membranes are normal.  Eyes: Conjunctivae and EOM are normal.  Cardiovascular: Normal rate and regular rhythm.   No murmur heard. Respiratory: Effort normal and breath sounds normal. No respiratory distress.  Neurological: He is alert and oriented to person, place, and time. Coordination and gait normal.  Skin: Skin is warm. No erythema.  Psychiatric: He has a normal mood and affect.  Well groomed, good eye contact      ASSESSMENT AND PLAN:    Dave Luna was seen today for follow-up.  Diagnoses and all orders for this visit:  Heart palpitations  Symptoms have improved, no other episode of presyncope.  Instructed to keep appointment with Dr Clifton James tomorrow. He could try to re-introduce caffeine in a few weeks, small amount, and monitor for changes. Instructed about warning signs.  I will see him back as needed.    Shanina Kepple G. Swazil[place]Jordan, MD  [facility]. [place] office.

## 2016-03-12 NOTE — Patient Instructions (Signed)
A few things to remember from today's visit:   Heart palpitations  Keep appt with cardiologists and continue monitoring. Stay off of caffeine for now, you may try to resume coffee intake in a few weeks and monitor for changes unless cardiologists recommends otherwise.   Please be sure medication list is accurate. If a new problem present, please set up appointment sooner than planned today.

## 2016-03-12 NOTE — Progress Notes (Signed)
Pre visit review using our clinic review tool, if applicable. No additional management support is needed unless otherwise documented below in the visit note. 

## 2016-03-13 ENCOUNTER — Encounter: Payer: Self-pay | Admitting: Cardiovascular Disease

## 2016-03-13 ENCOUNTER — Ambulatory Visit (INDEPENDENT_AMBULATORY_CARE_PROVIDER_SITE_OTHER): Payer: BLUE CROSS/BLUE SHIELD | Admitting: Cardiovascular Disease

## 2016-03-13 VITALS — BP 128/70 | HR 90 | Ht 68.0 in | Wt 212.1 lb

## 2016-03-13 DIAGNOSIS — R55 Syncope and collapse: Secondary | ICD-10-CM | POA: Diagnosis not present

## 2016-03-13 DIAGNOSIS — R002 Palpitations: Secondary | ICD-10-CM

## 2016-03-13 NOTE — Progress Notes (Signed)
Chief Complaint  Patient presents with  . Palpitations  . Near Syncope   History of Present Illness: 36 yo male no chronic medical issues who is here today for further evaluation of near syncope and palpitations. He has been seen in primary care. He began feeling his heart race in November 2017. In December 2017 he felt his heart racing, became dyspneic and sweaty. This lasted for one hour. He has had almost daily palpitations since then up until last week when he stopped drinking caffeine. He does not use illicit drugs or alcohol. He does not smoke. No prior heart issues. No chest pain, LE edema.   Primary Care Physician: Betty Swaziland, MD   Past Medical History:  Diagnosis Date  . Palpitations     Past Surgical History:  Procedure Laterality Date  . None      Current Outpatient Prescriptions  Medication Sig Dispense Refill  . ibuprofen (ADVIL,MOTRIN) 200 MG tablet Take 200 mg by mouth every 6 (six) hours as needed for moderate pain.     No current facility-administered medications for this visit.     No Known Allergies  Social History   Social History  . Marital status: Single    Spouse name: N/A  . Number of children: N/A  . Years of education: N/A   Occupational History  . Not on file.   Social History Main Topics  . Smoking status: Former Games developer  . Smokeless tobacco: Never Used  . Alcohol use No  . Drug use: No  . Sexual activity: Yes   Other Topics Concern  . Not on file   Social History Narrative  . No narrative on file    Family History  Problem Relation Age of Onset  . Cancer Mother   . Heart disease Maternal Uncle   . Hypertension Maternal Uncle   . Diabetes Paternal Grandfather     Review of Systems:  As stated in the HPI and otherwise negative.   BP 128/70 (BP Location: Left Arm, Patient Position: Sitting, Cuff Size: Normal)   Pulse 90   Ht 5\' 8"  (1.727 m)   Wt 212 lb 1.9 oz (96.2 kg)   BMI 32.25 kg/m   Physical  Examination: General: Well developed, well nourished, NAD  HEENT: OP clear, mucus membranes moist  SKIN: warm, dry. No rashes. Neuro: No focal deficits  Musculoskeletal: Muscle strength 5/5 all ext  Psychiatric: Mood and affect normal  Neck: No JVD, no carotid bruits, no thyromegaly, no lymphadenopathy.  Lungs:Clear bilaterally, no wheezes, rhonci, crackles Cardiovascular: Regular rate and rhythm. No murmurs, gallops or rubs. Abdomen:Soft. Bowel sounds present. Non-tender.  Extremities: No lower extremity edema. Pulses are 2 + in the bilateral DP/PT.  EKG:  EKG is ordered today. The ekg ordered today demonstrates NSR, rate 90 bpm.   Recent Labs: 02/07/2016: TSH 1.53 02/27/2016: BUN 17; Creatinine, Ser 0.96; Hemoglobin 13.7; Platelets 300; Potassium 4.0; Sodium 139   Lipid Panel No results found for: CHOL, TRIG, HDL, CHOLHDL, VLDL, LDLCALC, LDLDIRECT   Wt Readings from Last 3 Encounters:  03/13/16 212 lb 1.9 oz (96.2 kg)  03/12/16 212 lb 6 oz (96.3 kg)  02/27/16 213 lb (96.6 kg)     Other studies Reviewed: Additional studies/ records that were reviewed today include: . Review of the above records demonstrates:   Assessment and Plan:   1. Near Syncope/palpitations: He has had palpitations over the last 4 months. There have been several episodes where he felt dizzy, sweaty and dyspneic.  TSH normal in primary care. EKG today with sinus rhythm. Exam without murmurs. I will arrange an echo to assess left ventricular function and exclude structural heart defects. 48 hour monitor to define cause of palpitations. Avoid stimulants such as caffeine, nicotine and over the counter meds with stimulant qualities.   Current medicines are reviewed at length with the patient today.  The patient does not have concerns regarding medicines.  The following changes have been made:  no change  Labs/ tests ordered today include:   Orders Placed This Encounter  Procedures  . Holter monitor - 48 hour   . EKG 12-Lead  . ECHOCARDIOGRAM COMPLETE    Disposition:   FU with me as needed. I will call him with results of his testing.    Signed, Verne Carrowhristopher Osborne Serio, MD 03/13/2016 3:55 PM    [facility] [address], [place], [place]  [address] Phone: [phone]; Fax: [phone]

## 2016-03-13 NOTE — Patient Instructions (Addendum)
Medication Instructions:  Your physician recommends that you continue on your current medications as directed. Please refer to the Current Medication list given to you today.   Labwork: none  Testing/Procedures:  Your physician has recommended that you wear a holter monitor. Holter monitors are medical devices that record the heart's electrical activity. Doctors most often use these monitors to diagnose arrhythmias. Arrhythmias are problems with the speed or rhythm of the heartbeat. The monitor is a small, portable device. You can wear one while you do your normal daily activities. This is usually used to diagnose what is causing palpitations/syncope (passing out).   Your physician has requested that you have an echocardiogram. Echocardiography is a painless test that uses sound waves to create images of your heart. It provides your doctor with information about the size and shape of your heart and how well your heart's chambers and valves are working. This procedure takes approximately one hour. There are no restrictions for this procedure.    Follow-Up: Your physician recommends that you schedule a follow-up appointment as needed.     Any Other Special Instructions Will Be Listed Below (If Applicable).     If you need a refill on your cardiac medications before your next appointment, please call your pharmacy.

## 2016-03-14 ENCOUNTER — Encounter: Payer: Self-pay | Admitting: Family Medicine

## 2016-03-18 ENCOUNTER — Ambulatory Visit (INDEPENDENT_AMBULATORY_CARE_PROVIDER_SITE_OTHER): Payer: BLUE CROSS/BLUE SHIELD

## 2016-03-18 DIAGNOSIS — R002 Palpitations: Secondary | ICD-10-CM | POA: Diagnosis not present

## 2016-03-18 DIAGNOSIS — R55 Syncope and collapse: Secondary | ICD-10-CM

## 2016-03-26 ENCOUNTER — Encounter: Payer: Self-pay | Admitting: Cardiovascular Disease

## 2016-04-01 ENCOUNTER — Other Ambulatory Visit: Payer: Self-pay

## 2016-04-01 ENCOUNTER — Ambulatory Visit (HOSPITAL_COMMUNITY): Payer: BLUE CROSS/BLUE SHIELD | Attending: Cardiology

## 2016-04-01 DIAGNOSIS — I361 Nonrheumatic tricuspid (valve) insufficiency: Secondary | ICD-10-CM | POA: Insufficient documentation

## 2016-04-01 DIAGNOSIS — R55 Syncope and collapse: Secondary | ICD-10-CM

## 2016-04-01 DIAGNOSIS — R002 Palpitations: Secondary | ICD-10-CM

## 2016-06-29 NOTE — Progress Notes (Signed)
HPI:   Dave Luna is a 36 y.o. male, who is here today to follow on palpitations and near syncope.   He was seen on 03/12/16. Since his last OV he has followed with Dr Clifton JamesMcAlhany, cardiologists (03/12/16).  Holter and echo were done. Occasional PVC's and PAC's.  Echo 04/01/16: Left ventricle: The cavity size was normal. Systolic function was   normal. The estimated ejection fraction was in the range of 60%  to 65%. Wall motion was normal; there were no regional wall motion abnormalities. Left ventricular diastolic function parameters were normal.  Overall he is feeling better, no new symptoms reported. He has had 2 episodes since his last cardiologist visit, one lasted a few min and no associated symptoms. The second episode was associated with dizziness and diaphoresis, these symptoms lasted about 10 min but palpitation lasted longer.BP 148/98 and HR 99/min after episode resolved BP 110/77 and HR 70/min.   He noted that coffee intake in the afternoon precipitate episodes but no other trigger factors identified. He has tried breathing exercises thinking this may be related to anxiety, not sure if this helps.  He does not want to take daily medication, he would like something he can take as needed. He mentions that his father has Hx of atrial fib, wonders if his problem can increase risk of a more serious cardiac problem in the future.   Review of Systems  Constitutional: Negative for activity change, appetite change, fatigue, fever and unexpected weight change.  HENT: Negative for mouth sores, nosebleeds and sore throat.   Eyes: Negative for redness and visual disturbance.  Respiratory: Negative for cough, shortness of breath and wheezing.   Cardiovascular: Positive for palpitations. Negative for chest pain and leg swelling.  Gastrointestinal: Negative for abdominal pain, nausea and vomiting.  Genitourinary: Negative for decreased urine volume and hematuria.  Neurological:  Positive for dizziness. Negative for seizures, syncope, weakness and headaches.  Psychiatric/Behavioral: Negative for confusion. The patient is nervous/anxious.      Current Outpatient Prescriptions on File Prior to Visit  Medication Sig Dispense Refill  . ibuprofen (ADVIL,MOTRIN) 200 MG tablet Take 200 mg by mouth every 6 (six) hours as needed for moderate pain.     No current facility-administered medications on file prior to visit.      Past Medical History:  Diagnosis Date  . Palpitations    No Known Allergies  Social History   Social History  . Marital status: Single    Spouse name: N/A  . Number of children: N/A  . Years of education: N/A   Social History Main Topics  . Smoking status: Former Games developermoker  . Smokeless tobacco: Never Used  . Alcohol use No  . Drug use: No  . Sexual activity: Yes   Other Topics Concern  . None   Social History Narrative  . None    Vitals:   06/30/16 1152  BP: 102/70  Pulse: 87  Resp: 12    O2 sat at RA 99% Body mass index is 30.6 kg/m.   Physical Exam  Nursing note and vitals reviewed. Constitutional: He is oriented to person, place, and time. He appears well-developed. No distress.  HENT:  Head: Atraumatic.  Mouth/Throat: Oropharynx is clear and moist and mucous membranes are normal.  Eyes: Conjunctivae and EOM are normal.  Cardiovascular: Normal rate and regular rhythm.   No murmur heard. Respiratory: Effort normal and breath sounds normal. No respiratory distress.  GI: Soft. He exhibits no mass.  There is no hepatomegaly. There is no tenderness.  Musculoskeletal: He exhibits no edema or tenderness.  Neurological: He is alert and oriented to person, place, and time. He has normal strength. Coordination and gait normal.  Skin: Skin is warm. No erythema.  Psychiatric: His mood appears anxious. Cognition and memory are normal.  Well groomed, good eye contact.     ASSESSMENT AND PLAN:   Dave Luna was seen today for  follow-up.  Diagnoses and all orders for this visit:  Cardiac arrhythmia, unspecified cardiac arrhythmia type   Educated about Dx and prognosis. We discussed treatment options as well as side effects, including hypotension. Because episodes are now less frequent he agrees with trying low dose Propranolol, he is afraid of side effects so recommend trying 10 mg and can titrate to 20 mg. Instructed to let me know when he has an episode if it helps. Instructed about warning signs. F/U in 4 months if he has been symptomatic (prn), otherwise as needed and annually.  -     propranolol (INDERAL) 10 MG tablet; Take 1-2 tablets (10-20 mg total) by mouth 2 (two) times daily as needed.  Other specified anxiety disorders  He has some underlying mild anxiety, which seems to be aggravated by palpitation. I do not think palpitation is caused by anxiety. Benzo we discussed as a treatment options if Propranolol doe snot help, side effects discussed.Other treatment options like SSRI's will need daily intake,which he is not interested in doing so at this time.     Dave Luna G. [place], MD  [facility]. [place] office.

## 2016-06-30 ENCOUNTER — Ambulatory Visit (INDEPENDENT_AMBULATORY_CARE_PROVIDER_SITE_OTHER): Payer: BLUE CROSS/BLUE SHIELD | Admitting: Family Medicine

## 2016-06-30 ENCOUNTER — Encounter: Payer: Self-pay | Admitting: Family Medicine

## 2016-06-30 VITALS — BP 102/70 | HR 87 | Resp 12 | Ht 68.0 in | Wt 201.2 lb

## 2016-06-30 DIAGNOSIS — F418 Other specified anxiety disorders: Secondary | ICD-10-CM

## 2016-06-30 DIAGNOSIS — I499 Cardiac arrhythmia, unspecified: Secondary | ICD-10-CM

## 2016-06-30 MED ORDER — PROPRANOLOL HCL 10 MG PO TABS: 10.0000 mg | ORAL_TABLET | Freq: Two times a day (BID) | ORAL | 0 refills | Status: DC | PRN

## 2016-06-30 NOTE — Patient Instructions (Signed)
A few things to remember from today's visit:   Cardiac arrhythmia, unspecified cardiac arrhythmia type - Plan: propranolol (INDERAL) 10 MG tablet  Please let me know through My Chart or by calling the office about tolerance of new medication.   Please be sure medication list is accurate. If a new problem present, please set up appointment sooner than planned today.

## 2016-09-29 NOTE — Progress Notes (Signed)
HPI:   Mr.Domenic Leonie ManBaset is a 36 y.o. male, who is here today for 4 months follow-up. He was last seen on 06/30/16, when Propranolol 10 mg 1-2 tabs bid was started to help with palpitations and mild anxiety. Hx of cardiac arrhythmia, has completed cardiac work-up and otherwise not concerning for serious arrhythmia.  Holter monitor showed PVC's and PAC's (02/2016).  Since his last office visit he has had one mild episode of palpitation with no associated symptoms, he didn't feel like he needed to take Propranolol at this time. He has not taking Propranolol since his last OV. He is avoiding all caffeinated drinks and even chocolate.   Denies headache, visual changes, chest pain, dyspnea,claudication, focal weakness, or edema.  Lab Results  Component Value Date   CREATININE 0.96 02/27/2016   BUN 17 02/27/2016   NA 139 02/27/2016   K 4.0 02/27/2016   CL 103 02/27/2016   CO2 28 02/27/2016   Lab Results  Component Value Date   TSH 1.53 02/07/2016     New concern:  5 days of dark urine and increased urge to urinate. Mild dysuria at the beginning of voiding and suprapubic pressure. He denies fever,chills, gross hematuria, urgency, rectal pain, urethral discharge, or genital lesions. He denies sexual activity. No prior history of UTIs. He has been working outdoors for longer hours and riding his bike more frequent, he is trying to keep himself hydrated.   Review of Systems  Constitutional: Negative for activity change, appetite change, fatigue and fever.  HENT: Negative for mouth sores, nosebleeds, sore throat and trouble swallowing.   Eyes: Negative for redness and visual disturbance.  Respiratory: Negative for shortness of breath and wheezing.   Cardiovascular: Negative for chest pain and leg swelling.  Gastrointestinal: Negative for abdominal distention, nausea and vomiting.       No changes in bowel habits.  Endocrine: Negative for polydipsia, polyphagia and polyuria.    Genitourinary: Positive for dysuria. Negative for decreased urine volume, discharge, flank pain, genital sores and hematuria.  Musculoskeletal: Negative for back pain and myalgias.  Skin: Negative for pallor and rash.  Neurological: Negative for dizziness, syncope, weakness and headaches.  Psychiatric/Behavioral: Negative for confusion and sleep disturbance. The patient is nervous/anxious.     Current Outpatient Prescriptions on File Prior to Visit  Medication Sig Dispense Refill  . ibuprofen (ADVIL,MOTRIN) 200 MG tablet Take 200 mg by mouth every 6 (six) hours as needed for moderate pain.    Marland Kitchen. propranolol (INDERAL) 10 MG tablet Take 1-2 tablets (10-20 mg total) by mouth 2 (two) times daily as needed. 30 tablet 0   No current facility-administered medications on file prior to visit.     Past Medical History:  Diagnosis Date  . Palpitations    No Known Allergies  Social History   Social History  . Marital status: Single    Spouse name: N/A  . Number of children: N/A  . Years of education: N/A   Social History Main Topics  . Smoking status: Former Games developermoker  . Smokeless tobacco: Never Used  . Alcohol use No  . Drug use: No  . Sexual activity: Yes   Other Topics Concern  . None   Social History Narrative  . None    Vitals:   09/30/16 0739  BP: 104/70  Pulse: 60  Resp: 12  SpO2: 99%   Body mass index is 30.92 kg/m.   Physical Exam  Nursing note and vitals reviewed. Constitutional: He  is oriented to person, place, and time. He appears well-developed. No distress.  HENT:  Head: Normocephalic and atraumatic.  Mouth/Throat: Oropharynx is clear and moist. Mucous membranes are dry.  Eyes: Pupils are equal, round, and reactive to light. Conjunctivae are normal.  Cardiovascular: Normal rate and regular rhythm.   No murmur heard. Respiratory: Effort normal and breath sounds normal. No respiratory distress.  GI: Soft. He exhibits no mass. There is no hepatomegaly.  There is no tenderness. There is no CVA tenderness.  Musculoskeletal: He exhibits no edema or tenderness.  Lymphadenopathy:    He has no cervical adenopathy.  Neurological: He is alert and oriented to person, place, and time. He has normal strength. Coordination and gait normal.  Skin: Skin is warm. No erythema.  Psychiatric: His mood appears anxious.  Well groomed, good eye contact.    ASSESSMENT AND PLAN:   Mr. Irl was seen today for follow-up.  Diagnoses and all orders for this visit:  Cardiac arrhythmia, unspecified cardiac arrhythmia type  Stable overall and asymptomatic. No changes in current management, he can take Propranolol if needed as instructed. Continue avoiding caffeine. Instructed about warning signs. Follow-up in a year, before if needed.  Dysuria  Plan discussed possible etiologies. Because he denies risk factors for STDs, genital exam and urethral swab were not performed today. Urine dipstick was otherwise negative. We discussed options,including waiting for Ucx and treat accordingly. He would like empiric treatment. Side effects of Ciprofloxacin were discussed.  Adequate hydration, mildly dehydrated today.  Instructed about warning signs. We will follow urine culture. Follow-up as needed.  -     POCT urinalysis dipstick -     ciprofloxacin (CIPRO) 250 MG tablet; Take 1 tablet (250 mg total) by mouth 2 (two) times daily. -     Culture, Urine      -Mr. Ondra Crowl was advised to return sooner than planned today if new concerns arise.       Angelette Ganus G. [place], MD  [facility]. [place] office.

## 2016-09-30 ENCOUNTER — Ambulatory Visit (INDEPENDENT_AMBULATORY_CARE_PROVIDER_SITE_OTHER): Payer: BLUE CROSS/BLUE SHIELD | Admitting: Family Medicine

## 2016-09-30 ENCOUNTER — Encounter: Payer: Self-pay | Admitting: Family Medicine

## 2016-09-30 VITALS — BP 104/70 | HR 60 | Resp 12 | Ht 68.0 in | Wt 203.4 lb

## 2016-09-30 DIAGNOSIS — R300 Dysuria: Secondary | ICD-10-CM

## 2016-09-30 DIAGNOSIS — I499 Cardiac arrhythmia, unspecified: Secondary | ICD-10-CM | POA: Diagnosis not present

## 2016-09-30 LAB — POCT URINALYSIS DIPSTICK
Bilirubin, UA: NEGATIVE
Blood, UA: NEGATIVE
Glucose, UA: NEGATIVE
Ketones, UA: NEGATIVE
Leukocytes, UA: NEGATIVE
Nitrite, UA: NEGATIVE
Protein, UA: NEGATIVE
Spec Grav, UA: 1.015 (ref 1.010–1.025)
Urobilinogen, UA: 0.2 E.U./dL
pH, UA: 6 (ref 5.0–8.0)

## 2016-09-30 MED ORDER — CIPROFLOXACIN HCL 250 MG PO TABS: 250.0000 mg | ORAL_TABLET | Freq: Two times a day (BID) | ORAL | 0 refills | Status: DC

## 2016-09-30 NOTE — Patient Instructions (Addendum)
A few things to remember from today's visit:   Cardiac arrhythmia, unspecified cardiac arrhythmia type  Dysuria - Plan: POCT urinalysis dipstick, ciprofloxacin (CIPRO) 250 MG tablet    Adequate fluid intake, avoid holding urine for long hours, and over the counter Vit C OR cranberry capsules might help.  Today we will treat empirically with antibiotic, which we might need to change when urine culture comes back depending of bacteria susceptibility.  Seek immediate medical attention if severe abdominal pain, vomiting, fever/chills, or worsening symptoms. F/U if symptomatic are not any better after 2-3 days of antibiotic treatment.  Please be sure medication list is accurate. If a new problem present, please set up appointment sooner than planned today.

## 2016-10-01 ENCOUNTER — Encounter: Payer: Self-pay | Admitting: Family Medicine

## 2016-10-01 LAB — URINE CULTURE
MICRO NUMBER:: 80972990
Result:: NO GROWTH
SPECIMEN QUALITY:: ADEQUATE

## 2016-10-06 ENCOUNTER — Other Ambulatory Visit: Payer: Self-pay | Admitting: Family Medicine

## 2016-10-06 ENCOUNTER — Telehealth: Payer: Self-pay | Admitting: Family Medicine

## 2016-10-06 MED ORDER — SULFAMETHOXAZOLE-TRIMETHOPRIM 800-160 MG PO TABS: 1.0000 | ORAL_TABLET | Freq: Two times a day (BID) | ORAL | 0 refills | Status: AC

## 2016-10-06 NOTE — Telephone Encounter (Signed)
Rx for Bactrim DS to take bid for 7 days sent to his pharmacy.  Thanks, BJ

## 2016-10-06 NOTE — Telephone Encounter (Signed)
Pt states he is still having symptoms of Abdominal discomfort, urgency to go to the bathroom, And a small amount of burning with urination.  Pt states he is taking advil for the pain.  Pt is requesting a different abx, but side effects were not good Heart racing, dizzy and lightheadedness.     [id number] - [place], [place] - [address]

## 2016-10-07 ENCOUNTER — Encounter (HOSPITAL_BASED_OUTPATIENT_CLINIC_OR_DEPARTMENT_OTHER): Payer: Self-pay

## 2016-10-07 ENCOUNTER — Emergency Department (HOSPITAL_BASED_OUTPATIENT_CLINIC_OR_DEPARTMENT_OTHER)
Admission: EM | Admit: 2016-10-07 | Discharge: 2016-10-07 | Disposition: A | Discharge status: Home / Self Care | Payer: BLUE CROSS/BLUE SHIELD | Attending: Emergency Medicine | Admitting: Emergency Medicine

## 2016-10-07 DIAGNOSIS — Z87891 Personal history of nicotine dependence: Secondary | ICD-10-CM | POA: Insufficient documentation

## 2016-10-07 DIAGNOSIS — Z79899 Other long term (current) drug therapy: Secondary | ICD-10-CM | POA: Diagnosis not present

## 2016-10-07 DIAGNOSIS — N390 Urinary tract infection, site not specified: Secondary | ICD-10-CM | POA: Diagnosis not present

## 2016-10-07 DIAGNOSIS — R350 Frequency of micturition: Secondary | ICD-10-CM | POA: Diagnosis not present

## 2016-10-07 LAB — URINALYSIS, ROUTINE W REFLEX MICROSCOPIC
Bilirubin Urine: NEGATIVE
Glucose, UA: NEGATIVE mg/dL
Hgb urine dipstick: NEGATIVE
Ketones, ur: 15 mg/dL — AB
Leukocytes, UA: NEGATIVE
Nitrite: NEGATIVE
Protein, ur: NEGATIVE mg/dL
Specific Gravity, Urine: <1.005 — ABNORMAL LOW (ref 1.005–1.030)
pH: 6.5 (ref 5.0–8.0)

## 2016-10-07 LAB — CBG MONITORING, ED: Glucose-Capillary: 95 mg/dL (ref 65–99)

## 2016-10-07 MED ORDER — AZITHROMYCIN 1 G PO PACK
1.0000 g | PACK | Freq: Once | ORAL | Status: AC
  Administered 2016-10-07: 1 g via ORAL
  Filled 2016-10-07: qty 1

## 2016-10-07 MED ORDER — CEFTRIAXONE SODIUM 250 MG IJ SOLR
250.0000 mg | Freq: Once | INTRAMUSCULAR | Status: AC
  Administered 2016-10-07: 250 mg via INTRAMUSCULAR
  Filled 2016-10-07: qty 250

## 2016-10-07 MED ORDER — DOXYCYCLINE HYCLATE 100 MG PO TABS: 100.0000 mg | ORAL_TABLET | Freq: Two times a day (BID) | ORAL | 0 refills | Status: DC

## 2016-10-07 MED FILL — DOXYCYCLINE HYCLATE 100 MG: 100 | 10 days supply | Qty: 20 | Fill #0

## 2016-10-07 NOTE — ED Triage Notes (Signed)
Pt states he was dx with UTI 1 week ago-started on abx-had reaction and was switched to septra-now c/o increase in pain and urinary freq-NAD-steady gait

## 2016-10-07 NOTE — Discharge Instructions (Signed)
I want you to start doxycycline for possibly infection of prostate/urinary tract. Please follow up with your PCP in next 3- 4 days.

## 2016-10-07 NOTE — ED Provider Notes (Signed)
[facility] Provider Note   CSN: [id number] Arrival date & time: 10/07/16  1341     History   Chief Complaint Chief Complaint  Patient presents with  . Urinary Frequency    HPI Dave Luna is a 36 y.o. male.  HPI Patient presenting with dsyuria. Patient states about 10 days ago he had symptoms. Dysuria and urgency of urination. Patient denies any penile pain, testicular pain, penile discharge. Patient states he had abdominal pain after he urinates. Last 3 days patient has had discomfort after urination. Today he continues to have the abdominal pain, however now it lasting longer after urinationes. Patient denies any hx of kidney stones. Patient denies nausea/vomiting. Denies any fevers or chills. Patient denies any constipation. Patient states he has not been sexually active in about 3 years. Patient states that about 1 week ago he was seen and prescribed bactrim, however he only took this medication for two days.     Past Medical History:  Diagnosis Date  . Palpitations     Patient Active Problem List   Diagnosis Date Noted  . Cardiac arrhythmia, unspecified 06/30/2016  . BMI 33.0-33.9,adult 02/06/2016    Past Surgical History:  Procedure Laterality Date  . None         Home Medications    Prior to Admission medications   Medication Sig Start Date End Date Taking? Authorizing Provider  doxycycline (VIBRA-TABS) 100 MG tablet Take 1 tablet (100 mg total) by mouth 2 (two) times daily. 10/07/16   Khala Tarte, Antionette Poles, MD  ibuprofen (ADVIL,MOTRIN) 200 MG tablet Take 200 mg by mouth every 6 (six) hours as needed for moderate pain.    [provider]  propranolol (INDERAL) 10 MG tablet Take 1-2 tablets (10-20 mg total) by mouth 2 (two) times daily as needed. 06/30/16   [place], Betty G, MD  sulfamethoxazole-trimethoprim (BACTRIM DS,SEPTRA DS) 800-160 MG tablet Take 1 tablet by mouth 2 (two) times daily. 10/06/16 10/13/16  [place], Betty G, MD    Family  History Family History  Problem Relation Age of Onset  . Cancer Mother   . Heart disease Maternal Uncle   . Hypertension Maternal Uncle   . Diabetes Paternal Grandfather     Social History Social History  Substance Use Topics  . Smoking status: Former Games developer  . Smokeless tobacco: Never Used  . Alcohol use No     Allergies   Ciprofloxacin   Review of Systems Review of Systems  Constitutional: Negative for chills and fever.  Gastrointestinal: Positive for abdominal pain. Negative for diarrhea, nausea and vomiting.  Genitourinary: Positive for dysuria, frequency and urgency. Negative for discharge, flank pain, penile swelling and testicular pain.     Physical Exam Updated Vital Signs BP (!) 131/94 (BP Location: Left Arm)   Pulse 86   Temp 98.5 F (36.9 C) (Oral)   Resp 18   Ht  (1.727 m)   Wt 90.3 kg (199 lb)   SpO2 98%   BMI 30.26 kg/m   Physical Exam  Constitutional: He is oriented to person, place, and time. He appears well-developed and well-nourished.  HENT:  Head: Normocephalic and atraumatic.  Eyes: Pupils are equal, round, and reactive to light. Conjunctivae are normal.  Neck: Normal range of motion. Neck supple.  Cardiovascular: Normal rate.   Pulmonary/Chest: Effort normal.  Abdominal: Soft.  Pelvic tenderness   Genitourinary: Rectum normal and penis normal.  Genitourinary Comments: Attempted to perform a prostate exam. However patient was a unable to  sit still for this. Not due to pain however due to discomfort with this type of examination   Musculoskeletal: Normal range of motion.  Neurological: He is alert and oriented to person, place, and time.  Skin: Skin is warm. Capillary refill takes less than 2 seconds.     ED Treatments / Results  Labs (all labs ordered are listed, but only abnormal results are displayed) Labs Reviewed  URINALYSIS, ROUTINE W REFLEX MICROSCOPIC - Abnormal; Notable for the following:       Result Value    Specific Gravity, Urine <1.005 (*)    Ketones, ur 15 (*)    All other components within normal limits  URINE CULTURE  CBG MONITORING, ED  GC/CHLAMYDIA PROBE AMP ([place]) [facility]    EKG  EKG Interpretation None       Radiology No results found.  Procedures Procedures (including critical care time)  Medications Ordered in ED Medications  cefTRIAXone (ROCEPHIN) injection 250 mg (250 mg Intramuscular Given 10/07/16 1522)  azithromycin (ZITHROMAX) powder 1 g (1 g Oral Given 10/07/16 1522)     Initial Impression / Assessment and Plan / ED Course  I have reviewed the triage vital signs and the nursing notes.  Pertinent labs & imaging results that were available during my care of the patient were reviewed by me and considered in my medical decision making (see chart for details). Patient presented with dysuria and pelvic pain most given the nature of this pain most concern for infection. Will treat for possibly STDs and prostatitis as patient did not tolerate the exam. Patient to follow up with PCP for urology consult if not improved in the next couple of days.    Final Clinical Impressions(s) / ED Diagnoses   Final diagnoses:  Lower urinary tract infectious disease    New Prescriptions Discharge Medication List as of 10/07/2016  3:20 PM       Berton BonMikell, Lakyra Tippins Zahra, MD 10/07/16 1602    Melene PlanFloyd, Dan, DO 10/08/16 1413

## 2016-10-07 NOTE — Telephone Encounter (Signed)
I left a voicemail for patient letting him know that a new antibiotic has been sent in and to let us know if he's not any better after completing it.

## 2016-10-08 LAB — URINE CULTURE: Culture: NO GROWTH

## 2016-10-08 LAB — GC/CHLAMYDIA PROBE AMP (~~LOC~~) NOT AT ARMC
Chlamydia: NEGATIVE
Neisseria Gonorrhea: NEGATIVE

## 2016-10-12 DIAGNOSIS — S83242A Other tear of medial meniscus, current injury, left knee, initial encounter: Secondary | ICD-10-CM | POA: Diagnosis not present

## 2016-10-15 ENCOUNTER — Encounter: Payer: Self-pay | Admitting: Family Medicine

## 2016-10-16 ENCOUNTER — Ambulatory Visit: Payer: BLUE CROSS/BLUE SHIELD | Admitting: Family Medicine

## 2016-10-16 DIAGNOSIS — M25562 Pain in left knee: Secondary | ICD-10-CM | POA: Diagnosis not present

## 2016-10-18 NOTE — Progress Notes (Signed)
HPI:   DaveDave Luna is a 36 y.o. male, who is here today to follow on recent ER visit.   He was seen recently in the ER on 10/07/16 because persistent urinary symptoms. I saw him on 09/30/16, when Cipro was recommended for UTI like symptoms. He did not tolerate medications well, abx caused worsening palpitations, symptoms an hour after taking Cipro with 2 doses he took.  Ucx no growth.  Bactrim was recommended, 10/06/16, which he started taking but did not help.  U/A + ketones. Ucx 10/07/16 no growth.  Rx for Doxycycline was given in the ER, completed 10 days of treatment on 10/17/2016. Symptoms have resolved. History and to drink more fluids. He denies fever, chills, abdominal pain, back pain, gross hematuria, urethral discharge, or rectal pain. He denies sexual activity or rectal intercourse.  Otherwise he tolerated antibiotics well, he had one episode of loose stool.  Last night he had an episode of heart palpitations, repeat was 160/100 and HR 100/min. He took Propranolol 10 mg x 1 and symptoms resolved in 30 minutes. He denies side effects from medications.  Review of Systems  Constitutional: Negative for appetite change, chills, fatigue and fever.  HENT: Negative for mouth sores, nosebleeds and trouble swallowing.   Respiratory: Negative for chest tightness, shortness of breath and wheezing.   Cardiovascular: Positive for palpitations. Negative for chest pain and leg swelling.  Gastrointestinal: Positive for diarrhea. Negative for abdominal pain, blood in stool, nausea and vomiting.  Genitourinary: Negative for decreased urine volume, discharge, dysuria, flank pain, frequency, hematuria, scrotal swelling and urgency.  Musculoskeletal: Negative for back pain and myalgias.  Skin: Negative for pallor and rash.  Neurological: Negative for syncope, weakness and headaches.  Psychiatric/Behavioral: Negative for confusion and sleep disturbance. The patient is nervous/anxious.        Current Outpatient Prescriptions on File Prior to Visit  Medication Sig Dispense Refill  . ibuprofen (ADVIL,MOTRIN) 200 MG tablet Take 200 mg by mouth every 6 (six) hours as needed for moderate pain.    Marland Kitchen propranolol (INDERAL) 10 MG tablet Take 1-2 tablets (10-20 mg total) by mouth 2 (two) times daily as needed. 30 tablet 0   No current facility-administered medications on file prior to visit.      Past Medical History:  Diagnosis Date  . Palpitations    Allergies  Allergen Reactions  . Ciprofloxacin Palpitations    Social History   Social History  . Marital status: Single    Spouse name: N/A  . Number of children: N/A  . Years of education: N/A   Social History Main Topics  . Smoking status: Former Games developer  . Smokeless tobacco: Never Used  . Alcohol use No  . Drug use: No  . Sexual activity: Not Asked   Other Topics Concern  . None   Social History Narrative  . None    Vitals:   10/19/16 0726  BP: 118/80  Pulse: 87  Resp: 12  SpO2: 98%   Body mass index is 30.11 kg/m.  Physical Exam  Nursing note and vitals reviewed. Constitutional: He is oriented to person, place, and time. He appears well-developed. No distress.  HENT:  Head: Normocephalic and atraumatic.  Mouth/Throat: Oropharynx is clear and moist and mucous membranes are normal.  Eyes: Pupils are equal, round, and reactive to light. Conjunctivae are normal.  Cardiovascular: Normal rate and regular rhythm.   No murmur heard. Pulses:      Dorsalis pedis pulses are 2+  on the right side, and 2+ on the left side.  Respiratory: Effort normal and breath sounds normal. No respiratory distress.  GI: Soft. He exhibits no mass. There is no hepatomegaly. There is no tenderness. There is no CVA tenderness.  Musculoskeletal: He exhibits no edema.  Neurological: He is alert and oriented to person, place, and time. He has normal strength. Gait normal.  Skin: Skin is warm. No erythema.  Psychiatric:  His mood appears anxious.  Well groomed, good eye contact.    ASSESSMENT AND PLAN:  Dave Luna was seen today for follow-up.  Diagnoses and all orders for this visit:  Cardiac arrhythmia, unspecified cardiac arrhythmia type  Continue Propranolol 10 mg daily as needed for palpitations.  We discussed some side effects. Instructed about warning signs. I will see Dave Luna back in a year, before if needed.  Dysuria  Urinary symptoms resolved. Plan discussed possible etiologies, acute prostatitis most likely. Educated about diagnosis, I don't think further workup is necessary at this time. He was instructed to let me know if urinary symptoms re-occur in the next 3-4 weeks, in which case he will be back to lab. We might consider urology referral.   15 min face to face OV. > 50% was dedicated to discussion of differential Dx and risk factors for urinary symptoms in male, as well as prognosis, treatment options, and some side effects of abx. I recommend taking a daily probiotic for 3-4 weeks given the fact he took 3 different abxs. Due to anxiety extra time was needed for reassurance.     Lurie Mullane G. [place], MD  [facility]. [place] office.

## 2016-10-19 ENCOUNTER — Ambulatory Visit (INDEPENDENT_AMBULATORY_CARE_PROVIDER_SITE_OTHER): Payer: BLUE CROSS/BLUE SHIELD | Admitting: Family Medicine

## 2016-10-19 ENCOUNTER — Encounter: Payer: Self-pay | Admitting: Family Medicine

## 2016-10-19 VITALS — BP 118/80 | HR 87 | Resp 12 | Ht 68.0 in | Wt 198.0 lb

## 2016-10-19 DIAGNOSIS — R300 Dysuria: Secondary | ICD-10-CM | POA: Diagnosis not present

## 2016-10-19 DIAGNOSIS — I499 Cardiac arrhythmia, unspecified: Secondary | ICD-10-CM | POA: Diagnosis not present

## 2016-10-26 HISTORY — PX: KNEE ARTHROSCOPY: SUR90

## 2016-10-29 DIAGNOSIS — S83512A Sprain of anterior cruciate ligament of left knee, initial encounter: Secondary | ICD-10-CM | POA: Diagnosis not present

## 2016-10-30 ENCOUNTER — Telehealth: Payer: Self-pay | Admitting: *Deleted

## 2016-10-30 NOTE — Telephone Encounter (Signed)
   [facility] Pre-operative Risk Assessment    Request for surgical clearance:  1. What type of surgery is being performed? Left Knee Scope and ACL reconstruction  2. When is this surgery scheduled? Pending   3. Are there any medications that need to be held prior to surgery and how long?  N/A  4.   Practice name and name of physician performing surgery? [facility] Andi Hence, MD  5.   What is your office phone and fax number? Phone [phone] ext [phone] Fax [phone] Attention Sherri  6.   Anesthesia type (None, local, MAC, general) ? N/A   Ebonye Reade R 10/30/2016, 2:59 PM  _________________________________________________________________   (provider comments below)

## 2016-10-30 NOTE — Telephone Encounter (Signed)
Patient of Dr. Clifton James Cardiac clearance requested but patient has not been seen since February 2018. Office visit needed.  Dave Luna, Cordelia Poche 10/30/2016 5:21 PM Beeper [phone]

## 2016-11-02 NOTE — Telephone Encounter (Signed)
Left message to call back after 1pm for cardiac clearance

## 2016-11-02 NOTE — Telephone Encounter (Signed)
F/u Message ° ° pt returning call. Please call back to discuss  °

## 2016-11-03 NOTE — Telephone Encounter (Signed)
Follow up   Pt is returning call    

## 2016-11-03 NOTE — Telephone Encounter (Signed)
I spoke with pt and scheduled him to see B. Simmons, PA on 11/11/16 at 10:30.

## 2016-11-11 ENCOUNTER — Encounter: Payer: Self-pay | Admitting: Cardiology

## 2016-11-11 ENCOUNTER — Ambulatory Visit (INDEPENDENT_AMBULATORY_CARE_PROVIDER_SITE_OTHER): Payer: BLUE CROSS/BLUE SHIELD | Admitting: Cardiology

## 2016-11-11 VITALS — BP 122/66 | HR 74 | Resp 16 | Ht 68.0 in | Wt 192.6 lb

## 2016-11-11 DIAGNOSIS — Z01810 Encounter for preprocedural cardiovascular examination: Secondary | ICD-10-CM | POA: Diagnosis not present

## 2016-11-11 NOTE — Patient Instructions (Signed)
Medication Instructions:  Your physician recommends that you continue on your current medications as directed. Please refer to the Current Medication list given to you today.   Labwork: NONE ORDERED TODAY  Testing/Procedures: NONE ORDERED TODAY  Follow-Up: AS NEEDED   Any Other Special Instructions Will Be Listed Below (If Applicable).     If you need a refill on your cardiac medications before your next appointment, please call your pharmacy. .Marland Kitchen

## 2016-11-11 NOTE — Progress Notes (Signed)
11/11/2016 Dave Luna   December 14, 1980  [id number]  Primary Physician [place], Betty G, MD Primary Cardiologist: Dr. Clifton James   Reason for Visit/CC: Surgical Clearance   HPI:  Dave Luna is a 36 y.o. male who is being seen today for preoperative clearance prior to undergoing left knee scope and ACL reconstruction. He does not have any chronic medical conditions and does not smoke.   He was seen earlier this year by Dr. Clifton James for near syncope and and daily palpitations. Pt admitted to regular caffeine consumption but denied use of illicit drugs, alcohol and other stimulants. Dr. Clifton James ordered a 48 hr Holter monitor as well as a 2D echo to assess for structural heart disease. Monitor showed NSR with rare PVCs. 2D echo was normal. LVEF 60-65% with normal wall motion. Left ventricular diastolic function parameters were normal. No valvular abnormalities. Aortic root normal in size. Dr. Clifton James did not feel any additional cardiac w/u was necessary. He recommended avoidance of stimulants and he offered Rx for metoprolol 25 mg if pt was still symptomatic with palpitations. It was outlined that patient was notified of results and option to start BB, however pt stated he would consider and call back if he decided to go with medical therapy. Pt never followed up with [name] but had his PCP prescribe propanolol, which he only takes PRN. He notes he has only had to use propanolol twice since it was prescribed. In retrospect, he feels his symptoms are likely driven by anxiety, when they occur.   He presents to clinic today for repeat evaluation given his last OV was > 6 months ago. [place] This was initially managed conservatively with physical therapy and strength exercises. He has still been able to maintain a fairly active lifestyle, however he feels his knee is progressively loosing stability and he would like to be able to do more strenuous activity as desired. 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 He walks up and down stairs daily in his home w/o limitation. No exertional CP or dyspnea.  No syncope or near syncope.   According to the Revised Cardiac Risk Index (RCRI), his Perioperative Risk of Major Cardiac Event is (%): 0.4 High Risk Surgery = No H/O Ischemic Heart Disease = No H/o CHF = No H/o CVA = No H/o DM on Insulin = No SCr >2.0 = No    Current Meds  Medication Sig  . ibuprofen (ADVIL,MOTRIN) 200 MG tablet Take 200 mg by mouth every 6 (six) hours as needed for moderate pain.  Marland Kitchen propranolol (INDERAL) 10 MG tablet Take 1-2 tablets (10-20 mg total) by mouth 2 (two) times daily as needed.   Allergies  Allergen Reactions  . Ciprofloxacin Palpitations   Past Medical History:  Diagnosis Date  . Palpitations    Family History  Problem Relation Age of Onset  . Cancer Mother   . Heart disease Maternal Uncle   . Hypertension Maternal Uncle   . Diabetes Paternal Grandfather    Past Surgical History:  Procedure Laterality Date  . None     Social History   Social History  . Marital status: Single    Spouse name: N/A  . Number of children: N/A  . Years of education: N/A   Occupational History  . Not on file.   Social History Main Topics  . Smoking status: Former Games developer  . Smokeless tobacco: Never Used  . Alcohol use No  .  Drug use: No  . Sexual activity: Not on file   Other Topics Concern  . Not on file   Social History Narrative  . No narrative on file     Review of Systems: General: negative for chills, fever, night sweats or weight changes.  Cardiovascular: negative for chest pain, dyspnea on exertion, edema, orthopnea, palpitations, paroxysmal nocturnal dyspnea or shortness of breath Dermatological: negative for rash Respiratory: negative for cough or wheezing Urologic: negative for hematuria Abdominal: negative for nausea, vomiting, diarrhea, bright red blood per rectum, melena, or  hematemesis Neurologic: negative for visual changes, syncope, or dizziness All other systems reviewed and are otherwise negative except as noted above.   Physical Exam:  Blood pressure 122/66, pulse 74, resp. rate 16, height 5\' 8"  (1.727 m), weight 192 lb 9.6 oz (87.4 kg), SpO2 98 %.  General appearance: alert, cooperative and no distress Neck: no carotid bruit and no JVD Lungs: clear to auscultation bilaterally Heart: regular rate and rhythm, S1, S2 normal, no murmur, click, rub or gallop Extremities: extremities normal, atraumatic, no cyanosis or edema Pulses: 2+ and symmetric Skin: Skin color, texture, turgor normal. No rashes or lesions Neurologic: Grossly normal  EKG NSR  69 bpm. No ischemic abnoramalities -- personally reviewed   ASSESSMENT AND PLAN:   1. Pre-operative Risk Assessment: 36 y/o healthy male with no chronic medical conditions, previously evaluated for palpitations, found to have rare benign PVCs with normal echocardiogram 03/2016, negative for structural heart disease, needing to undergo reconstructive ACL surgery. He is able to complete > 4METs of physical activity w/o ischemic symptoms. No chest pain or dyspnea with exertion. Physical exam is benign. According to the Revised Cardiac Risk Index (RCRI), his Perioperative Risk of Major Cardiac Event is (%): 0.4, thus ok to proceed with noncardiac surgery w/o need for additional cardiac testing.   High Risk Surgery = No H/O Ischemic Heart Disease = No H/o CHF = No H/o CVA = No H/o DM on Insulin = No SCr >2.0 = No   Perioperative risk of Major Cardiac Event is 0.4%  2. Palpitations: found to have rare PVCs on 48 hr monitor earlier this year with normal 2D echo. He takes propanolol PRN and has only had to use twice. Overall, much improved. EKG today shows NSR. He can f/u PRN.   Follow-Up: f/u PRN with Dr. Jairo BenMcAlhany  Kimberley Dastrup PA-C, [facility] 11/11/2016 11:06 AM

## 2016-11-25 DIAGNOSIS — S83512A Sprain of anterior cruciate ligament of left knee, initial encounter: Secondary | ICD-10-CM | POA: Diagnosis not present

## 2016-11-25 DIAGNOSIS — M2242 Chondromalacia patellae, left knee: Secondary | ICD-10-CM | POA: Diagnosis not present

## 2016-11-25 DIAGNOSIS — Y998 Other external cause status: Secondary | ICD-10-CM | POA: Diagnosis not present

## 2016-11-25 DIAGNOSIS — S83212A Bucket-handle tear of medial meniscus, current injury, left knee, initial encounter: Secondary | ICD-10-CM | POA: Diagnosis not present

## 2016-11-25 DIAGNOSIS — S83242A Other tear of medial meniscus, current injury, left knee, initial encounter: Secondary | ICD-10-CM | POA: Diagnosis not present

## 2016-11-25 DIAGNOSIS — G8918 Other acute postprocedural pain: Secondary | ICD-10-CM | POA: Diagnosis not present

## 2016-11-25 DIAGNOSIS — S83282A Other tear of lateral meniscus, current injury, left knee, initial encounter: Secondary | ICD-10-CM | POA: Diagnosis not present

## 2016-11-25 DIAGNOSIS — X58XXXA Exposure to other specified factors, initial encounter: Secondary | ICD-10-CM | POA: Diagnosis not present

## 2016-12-03 DIAGNOSIS — S83282D Other tear of lateral meniscus, current injury, left knee, subsequent encounter: Secondary | ICD-10-CM | POA: Diagnosis not present

## 2016-12-03 DIAGNOSIS — S83242D Other tear of medial meniscus, current injury, left knee, subsequent encounter: Secondary | ICD-10-CM | POA: Diagnosis not present

## 2016-12-07 DIAGNOSIS — M25562 Pain in left knee: Secondary | ICD-10-CM | POA: Diagnosis not present

## 2016-12-07 DIAGNOSIS — M25662 Stiffness of left knee, not elsewhere classified: Secondary | ICD-10-CM | POA: Diagnosis not present

## 2016-12-07 DIAGNOSIS — M6281 Muscle weakness (generalized): Secondary | ICD-10-CM | POA: Diagnosis not present

## 2016-12-07 DIAGNOSIS — R262 Difficulty in walking, not elsewhere classified: Secondary | ICD-10-CM | POA: Diagnosis not present

## 2016-12-10 DIAGNOSIS — M25562 Pain in left knee: Secondary | ICD-10-CM | POA: Diagnosis not present

## 2016-12-14 DIAGNOSIS — M6281 Muscle weakness (generalized): Secondary | ICD-10-CM | POA: Diagnosis not present

## 2016-12-14 DIAGNOSIS — M25562 Pain in left knee: Secondary | ICD-10-CM | POA: Diagnosis not present

## 2016-12-14 DIAGNOSIS — R262 Difficulty in walking, not elsewhere classified: Secondary | ICD-10-CM | POA: Diagnosis not present

## 2016-12-14 DIAGNOSIS — M25662 Stiffness of left knee, not elsewhere classified: Secondary | ICD-10-CM | POA: Diagnosis not present

## 2016-12-21 DIAGNOSIS — R262 Difficulty in walking, not elsewhere classified: Secondary | ICD-10-CM | POA: Diagnosis not present

## 2016-12-21 DIAGNOSIS — M6281 Muscle weakness (generalized): Secondary | ICD-10-CM | POA: Diagnosis not present

## 2016-12-21 DIAGNOSIS — M25562 Pain in left knee: Secondary | ICD-10-CM | POA: Diagnosis not present

## 2016-12-21 DIAGNOSIS — M25662 Stiffness of left knee, not elsewhere classified: Secondary | ICD-10-CM | POA: Diagnosis not present

## 2016-12-25 DIAGNOSIS — M6281 Muscle weakness (generalized): Secondary | ICD-10-CM | POA: Diagnosis not present

## 2016-12-25 DIAGNOSIS — M25562 Pain in left knee: Secondary | ICD-10-CM | POA: Diagnosis not present

## 2016-12-25 DIAGNOSIS — M25662 Stiffness of left knee, not elsewhere classified: Secondary | ICD-10-CM | POA: Diagnosis not present

## 2016-12-25 DIAGNOSIS — R262 Difficulty in walking, not elsewhere classified: Secondary | ICD-10-CM | POA: Diagnosis not present

## 2016-12-28 DIAGNOSIS — M6281 Muscle weakness (generalized): Secondary | ICD-10-CM | POA: Diagnosis not present

## 2016-12-28 DIAGNOSIS — R262 Difficulty in walking, not elsewhere classified: Secondary | ICD-10-CM | POA: Diagnosis not present

## 2016-12-28 DIAGNOSIS — M25662 Stiffness of left knee, not elsewhere classified: Secondary | ICD-10-CM | POA: Diagnosis not present

## 2016-12-28 DIAGNOSIS — M25562 Pain in left knee: Secondary | ICD-10-CM | POA: Diagnosis not present

## 2017-01-01 DIAGNOSIS — M6281 Muscle weakness (generalized): Secondary | ICD-10-CM | POA: Diagnosis not present

## 2017-01-01 DIAGNOSIS — M25662 Stiffness of left knee, not elsewhere classified: Secondary | ICD-10-CM | POA: Diagnosis not present

## 2017-01-01 DIAGNOSIS — R262 Difficulty in walking, not elsewhere classified: Secondary | ICD-10-CM | POA: Diagnosis not present

## 2017-01-01 DIAGNOSIS — M25562 Pain in left knee: Secondary | ICD-10-CM | POA: Diagnosis not present

## 2017-01-07 DIAGNOSIS — M25562 Pain in left knee: Secondary | ICD-10-CM | POA: Diagnosis not present

## 2017-01-08 DIAGNOSIS — M25562 Pain in left knee: Secondary | ICD-10-CM | POA: Diagnosis not present

## 2017-01-08 DIAGNOSIS — M6281 Muscle weakness (generalized): Secondary | ICD-10-CM | POA: Diagnosis not present

## 2017-01-08 DIAGNOSIS — M25662 Stiffness of left knee, not elsewhere classified: Secondary | ICD-10-CM | POA: Diagnosis not present

## 2017-01-08 DIAGNOSIS — R262 Difficulty in walking, not elsewhere classified: Secondary | ICD-10-CM | POA: Diagnosis not present

## 2017-01-11 DIAGNOSIS — M6281 Muscle weakness (generalized): Secondary | ICD-10-CM | POA: Diagnosis not present

## 2017-01-11 DIAGNOSIS — M25662 Stiffness of left knee, not elsewhere classified: Secondary | ICD-10-CM | POA: Diagnosis not present

## 2017-01-11 DIAGNOSIS — M25562 Pain in left knee: Secondary | ICD-10-CM | POA: Diagnosis not present

## 2017-01-11 DIAGNOSIS — R262 Difficulty in walking, not elsewhere classified: Secondary | ICD-10-CM | POA: Diagnosis not present

## 2017-01-15 DIAGNOSIS — M25562 Pain in left knee: Secondary | ICD-10-CM | POA: Diagnosis not present

## 2017-01-15 DIAGNOSIS — M6281 Muscle weakness (generalized): Secondary | ICD-10-CM | POA: Diagnosis not present

## 2017-01-15 DIAGNOSIS — M25662 Stiffness of left knee, not elsewhere classified: Secondary | ICD-10-CM | POA: Diagnosis not present

## 2017-01-15 DIAGNOSIS — R262 Difficulty in walking, not elsewhere classified: Secondary | ICD-10-CM | POA: Diagnosis not present

## 2017-01-20 DIAGNOSIS — M25562 Pain in left knee: Secondary | ICD-10-CM | POA: Diagnosis not present

## 2017-01-20 DIAGNOSIS — M25662 Stiffness of left knee, not elsewhere classified: Secondary | ICD-10-CM | POA: Diagnosis not present

## 2017-01-20 DIAGNOSIS — R262 Difficulty in walking, not elsewhere classified: Secondary | ICD-10-CM | POA: Diagnosis not present

## 2017-01-20 DIAGNOSIS — M6281 Muscle weakness (generalized): Secondary | ICD-10-CM | POA: Diagnosis not present

## 2017-01-21 DIAGNOSIS — M25562 Pain in left knee: Secondary | ICD-10-CM | POA: Diagnosis not present

## 2017-01-21 DIAGNOSIS — M25662 Stiffness of left knee, not elsewhere classified: Secondary | ICD-10-CM | POA: Diagnosis not present

## 2017-01-21 DIAGNOSIS — M6281 Muscle weakness (generalized): Secondary | ICD-10-CM | POA: Diagnosis not present

## 2017-01-21 DIAGNOSIS — R262 Difficulty in walking, not elsewhere classified: Secondary | ICD-10-CM | POA: Diagnosis not present

## 2017-01-25 DIAGNOSIS — M6281 Muscle weakness (generalized): Secondary | ICD-10-CM | POA: Diagnosis not present

## 2017-01-25 DIAGNOSIS — M25562 Pain in left knee: Secondary | ICD-10-CM | POA: Diagnosis not present

## 2017-01-25 DIAGNOSIS — M25662 Stiffness of left knee, not elsewhere classified: Secondary | ICD-10-CM | POA: Diagnosis not present

## 2017-01-25 DIAGNOSIS — R262 Difficulty in walking, not elsewhere classified: Secondary | ICD-10-CM | POA: Diagnosis not present

## 2017-01-28 DIAGNOSIS — R262 Difficulty in walking, not elsewhere classified: Secondary | ICD-10-CM | POA: Diagnosis not present

## 2017-01-28 DIAGNOSIS — M25662 Stiffness of left knee, not elsewhere classified: Secondary | ICD-10-CM | POA: Diagnosis not present

## 2017-01-28 DIAGNOSIS — M6281 Muscle weakness (generalized): Secondary | ICD-10-CM | POA: Diagnosis not present

## 2017-01-28 DIAGNOSIS — M25562 Pain in left knee: Secondary | ICD-10-CM | POA: Diagnosis not present

## 2017-02-01 DIAGNOSIS — M6281 Muscle weakness (generalized): Secondary | ICD-10-CM | POA: Diagnosis not present

## 2017-02-01 DIAGNOSIS — R262 Difficulty in walking, not elsewhere classified: Secondary | ICD-10-CM | POA: Diagnosis not present

## 2017-02-01 DIAGNOSIS — M25562 Pain in left knee: Secondary | ICD-10-CM | POA: Diagnosis not present

## 2017-02-01 DIAGNOSIS — M25662 Stiffness of left knee, not elsewhere classified: Secondary | ICD-10-CM | POA: Diagnosis not present

## 2017-02-03 DIAGNOSIS — M25562 Pain in left knee: Secondary | ICD-10-CM | POA: Diagnosis not present

## 2017-02-03 DIAGNOSIS — R262 Difficulty in walking, not elsewhere classified: Secondary | ICD-10-CM | POA: Diagnosis not present

## 2017-02-03 DIAGNOSIS — M6281 Muscle weakness (generalized): Secondary | ICD-10-CM | POA: Diagnosis not present

## 2017-02-03 DIAGNOSIS — M25662 Stiffness of left knee, not elsewhere classified: Secondary | ICD-10-CM | POA: Diagnosis not present

## 2017-02-08 DIAGNOSIS — R262 Difficulty in walking, not elsewhere classified: Secondary | ICD-10-CM | POA: Diagnosis not present

## 2017-02-08 DIAGNOSIS — M25662 Stiffness of left knee, not elsewhere classified: Secondary | ICD-10-CM | POA: Diagnosis not present

## 2017-02-08 DIAGNOSIS — M6281 Muscle weakness (generalized): Secondary | ICD-10-CM | POA: Diagnosis not present

## 2017-02-08 DIAGNOSIS — M25562 Pain in left knee: Secondary | ICD-10-CM | POA: Diagnosis not present

## 2017-02-10 DIAGNOSIS — M6281 Muscle weakness (generalized): Secondary | ICD-10-CM | POA: Diagnosis not present

## 2017-02-10 DIAGNOSIS — R262 Difficulty in walking, not elsewhere classified: Secondary | ICD-10-CM | POA: Diagnosis not present

## 2017-02-10 DIAGNOSIS — M25662 Stiffness of left knee, not elsewhere classified: Secondary | ICD-10-CM | POA: Diagnosis not present

## 2017-02-10 DIAGNOSIS — M25562 Pain in left knee: Secondary | ICD-10-CM | POA: Diagnosis not present

## 2017-02-15 DIAGNOSIS — M25662 Stiffness of left knee, not elsewhere classified: Secondary | ICD-10-CM | POA: Diagnosis not present

## 2017-02-15 DIAGNOSIS — M25562 Pain in left knee: Secondary | ICD-10-CM | POA: Diagnosis not present

## 2017-02-15 DIAGNOSIS — R262 Difficulty in walking, not elsewhere classified: Secondary | ICD-10-CM | POA: Diagnosis not present

## 2017-02-15 DIAGNOSIS — M6281 Muscle weakness (generalized): Secondary | ICD-10-CM | POA: Diagnosis not present

## 2017-02-17 DIAGNOSIS — M25662 Stiffness of left knee, not elsewhere classified: Secondary | ICD-10-CM | POA: Diagnosis not present

## 2017-02-17 DIAGNOSIS — M25562 Pain in left knee: Secondary | ICD-10-CM | POA: Diagnosis not present

## 2017-02-17 DIAGNOSIS — R262 Difficulty in walking, not elsewhere classified: Secondary | ICD-10-CM | POA: Diagnosis not present

## 2017-02-17 DIAGNOSIS — M6281 Muscle weakness (generalized): Secondary | ICD-10-CM | POA: Diagnosis not present

## 2017-02-18 DIAGNOSIS — M25562 Pain in left knee: Secondary | ICD-10-CM | POA: Diagnosis not present

## 2017-02-20 ENCOUNTER — Emergency Department (HOSPITAL_BASED_OUTPATIENT_CLINIC_OR_DEPARTMENT_OTHER)
Admission: EM | Admit: 2017-02-20 | Discharge: 2017-02-20 | Disposition: A | Discharge status: Home / Self Care | Payer: BLUE CROSS/BLUE SHIELD | Attending: Emergency Medicine | Admitting: Emergency Medicine

## 2017-02-20 ENCOUNTER — Other Ambulatory Visit: Payer: Self-pay

## 2017-02-20 ENCOUNTER — Emergency Department (HOSPITAL_BASED_OUTPATIENT_CLINIC_OR_DEPARTMENT_OTHER): Payer: BLUE CROSS/BLUE SHIELD

## 2017-02-20 ENCOUNTER — Encounter (HOSPITAL_BASED_OUTPATIENT_CLINIC_OR_DEPARTMENT_OTHER): Payer: Self-pay | Admitting: Emergency Medicine

## 2017-02-20 DIAGNOSIS — Z79899 Other long term (current) drug therapy: Secondary | ICD-10-CM | POA: Insufficient documentation

## 2017-02-20 DIAGNOSIS — R519 Headache, unspecified: Secondary | ICD-10-CM

## 2017-02-20 DIAGNOSIS — R51 Headache: Secondary | ICD-10-CM | POA: Diagnosis not present

## 2017-02-20 DIAGNOSIS — Z87891 Personal history of nicotine dependence: Secondary | ICD-10-CM | POA: Insufficient documentation

## 2017-02-20 LAB — CBC
HCT: 44 % (ref 39.0–52.0)
Hemoglobin: 14.7 g/dL (ref 13.0–17.0)
MCH: 26.3 pg (ref 26.0–34.0)
MCHC: 33.4 g/dL (ref 30.0–36.0)
MCV: 78.6 fL (ref 78.0–100.0)
Platelets: 265 10*3/uL (ref 150–400)
RBC: 5.6 MIL/uL (ref 4.22–5.81)
RDW: 14.1 % (ref 11.5–15.5)
WBC: 5.4 10*3/uL (ref 4.0–10.5)

## 2017-02-20 LAB — BASIC METABOLIC PANEL
Anion gap: 8 (ref 5–15)
BUN: 11 mg/dL (ref 6–20)
CO2: 29 mmol/L (ref 22–32)
Calcium: 9.8 mg/dL (ref 8.9–10.3)
Chloride: 102 mmol/L (ref 101–111)
Creatinine, Ser: 0.92 mg/dL (ref 0.61–1.24)
GFR calc Af Amer: >60 mL/min (ref >60)
GFR calc non Af Amer: >60 mL/min (ref >60)
Glucose, Bld: 90 mg/dL (ref 65–99)
Potassium: 4.1 mmol/L (ref 3.5–5.1)
Sodium: 139 mmol/L (ref 135–145)

## 2017-02-20 MED ORDER — KETOROLAC TROMETHAMINE 30 MG/ML IJ SOLN
30.0000 mg | Freq: Once | INTRAMUSCULAR | Status: AC
  Administered 2017-02-20: 30 mg via INTRAVENOUS
  Filled 2017-02-20: qty 1

## 2017-02-20 MED ORDER — PROCHLORPERAZINE EDISYLATE 5 MG/ML IJ SOLN
10.0000 mg | Freq: Once | INTRAMUSCULAR | Status: AC
  Administered 2017-02-20: 10 mg via INTRAVENOUS
  Filled 2017-02-20: qty 2

## 2017-02-20 MED ORDER — DIPHENHYDRAMINE HCL 50 MG/ML IJ SOLN
25.0000 mg | Freq: Once | INTRAMUSCULAR | Status: AC
  Administered 2017-02-20: 25 mg via INTRAVENOUS
  Filled 2017-02-20: qty 1

## 2017-02-20 MED ORDER — SODIUM CHLORIDE 0.9 % IV BOLUS (SEPSIS)
1000.0000 mL | Freq: Once | INTRAVENOUS | Status: AC
Start: 2017-02-20 — End: 2017-02-20
  Administered 2017-02-20: 1000 mL via INTRAVENOUS

## 2017-02-20 NOTE — Discharge Instructions (Signed)
Evaluation today has been reassuring, blood pressure has improved on its own, labs look good and there is no acute findings on your head CT.  As we discussed there were some areas that suggest inflammatory changes on your head CT, please call to schedule follow-up appointment with neurology as well as your primary care doctor for continued workup of this.  If you have severe headache develop vision changes, dizziness, weakness or numbness in your arms or legs or other new or concerning symptoms please return to the ED for reevaluation.

## 2017-02-20 NOTE — ED Triage Notes (Signed)
Pt reports headache this morning. Check his BP at home and found it to be high. No hx of HTN.

## 2017-02-20 NOTE — ED Provider Notes (Signed)
[facility] EMERGENCY DEPARTMENT Provider Note   CSN: [id number] Arrival date & time: 02/20/17  1119     History   Chief Complaint Chief Complaint  Patient presents with  . Headache    HPI Dave Luna is a 37 y.o. male.  Dave Luna is a 37 y.o. Male who is otherwise healthy, presents to the ED for evaluation of headache.  Patient localizes headache to behind both eyes, no associated vision changes. Patient reports at 6:00 this morning he woke up and noticed a headache, he took 2 ibuprofen and tried to go back to sleep, but headache has continued, he took his blood pressure at home and noted his BP to be elevated with systolic in the 150s, normal for him is typically < 120, not on any BP meds. Headache was still persisting so pt came in to be evaluated.  Patient reports the headache did not wake him from sleep, it was not maximal intensity at onset but has gotten slightly worse as the day has gone on.  Patient denies any associated fevers or chills no neck pain or stiffness, no dizziness, nausea or vomiting no numbness, weakness or tingling in any of the extremities.  Patient reports he has had headaches in the past, typically related to sinus infection which have been more severe in nature, but reports this pain feels different.  Patient denies any nasal congestion rhinorrhea or other URI symptoms today associated with this headache.      Past Medical History:  Diagnosis Date  . Palpitations     Patient Active Problem List   Diagnosis Date Noted  . Cardiac arrhythmia, unspecified 06/30/2016  . BMI 33.0-33.9,adult 02/06/2016    Past Surgical History:  Procedure Laterality Date  . None         Home Medications    Prior to Admission medications   Medication Sig Start Date End Date Taking? Authorizing Provider  ibuprofen (ADVIL,MOTRIN) 200 MG tablet Take 200 mg by mouth every 6 (six) hours as needed for moderate pain.    [provider]  propranolol  (INDERAL) 10 MG tablet Take 1-2 tablets (10-20 mg total) by mouth 2 (two) times daily as needed. 06/30/16   [place], Betty G, MD    Family History Family History  Problem Relation Age of Onset  . Cancer Mother   . Heart disease Maternal Uncle   . Hypertension Maternal Uncle   . Diabetes Paternal Grandfather     Social History Social History   Tobacco Use  . Smoking status: Former Games developermoker  . Smokeless tobacco: Never Used  Substance Use Topics  . Alcohol use: No  . Drug use: No     Allergies   Ciprofloxacin   Review of Systems Review of Systems  Constitutional: Negative for chills and fever.  HENT: Negative for congestion, ear pain, rhinorrhea and sore throat.   Eyes: Negative for photophobia, pain and visual disturbance.  Respiratory: Negative for cough and shortness of breath.   Cardiovascular: Negative for chest pain.  Gastrointestinal: Negative for abdominal pain, nausea and vomiting.  Musculoskeletal: Negative for back pain, myalgias, neck pain and neck stiffness.  Skin: Negative for rash.  Neurological: Positive for headaches. Negative for dizziness, tremors, syncope, facial asymmetry, speech difficulty, weakness, light-headedness and numbness.     Physical Exam Updated Vital Signs BP (!) 153/116 (BP Location: Right Arm)   Pulse 80   Temp 98.5 F (36.9 C) (Oral)   Resp 16   Ht 5\' 9"  (1.753 m)  Wt 84.4 kg (186 lb)   SpO2 100%   BMI 27.47 kg/m   Physical Exam  Constitutional: He is oriented to person, place, and time. He appears well-developed and well-nourished.  Non-toxic appearance. He does not appear ill. No distress.  HENT:  Head: Normocephalic and atraumatic.  Right Ear: Tympanic membrane normal.  Left Ear: Tympanic membrane normal.  Mouth/Throat: Oropharynx is clear and moist.  Eyes: EOM are normal. Pupils are equal, round, and reactive to light. Right eye exhibits no discharge. Left eye exhibits no discharge. Right eye exhibits no nystagmus. Left  eye exhibits no nystagmus.  Neck: Normal range of motion. Neck supple. No neck rigidity. No Brudzinski's sign and no Kernig's sign noted.  Cardiovascular: Normal rate, regular rhythm, normal heart sounds and intact distal pulses.  Pulmonary/Chest: Effort normal and breath sounds normal. No stridor. No respiratory distress. He has no wheezes. He has no rales.  Abdominal: Soft. Bowel sounds are normal. He exhibits no distension. There is no tenderness.  Neurological: He is alert and oriented to person, place, and time. Coordination normal. GCS eye subscore is 4. GCS verbal subscore is 5. GCS motor subscore is 6.  Speech is clear, able to follow commands CN III-XII intact Normal strength in upper and lower extremities bilaterally including dorsiflexion and plantar flexion, strong and equal grip strength Sensation normal to light and sharp touch Moves extremities without ataxia, coordination intact Normal finger to nose and rapid alternating movements No pronator drift  Skin: Skin is warm and dry. Capillary refill takes less than 2 seconds. No rash noted. He is not diaphoretic.  Psychiatric: He has a normal mood and affect. His behavior is normal.  Nursing note and vitals reviewed.    ED Treatments / Results  Labs (all labs ordered are listed, but only abnormal results are displayed) Labs Reviewed  CBC  BASIC METABOLIC PANEL    EKG  EKG Interpretation None       Radiology Ct Head Wo Contrast  Result Date: 02/20/2017 CLINICAL DATA:  Headache, acute.  Normal neuro exam.  Hypertension. EXAM: CT HEAD WITHOUT CONTRAST TECHNIQUE: Contiguous axial images were obtained from the base of the skull through the vertex without intravenous contrast. COMPARISON:  None. FINDINGS: Brain: Few areas of low-density in the peripheral cerebral white matter. Given the history of hypertension this could be chronic microvascular ischemic change. Demyelination, migraines, or inflammatory disease could also  give this pattern. Negative for infarct, hemorrhage, hydrocephalus, or masslike finding. No evidence of cortical edema. Vascular: No hyperdense vessel or unexpected calcification. Skull: Normal. Negative for fracture or focal lesion. Sinuses/Orbits: No acute finding. IMPRESSION: 1. No acute finding. 2. Mild patchy low-density in the cerebral white matter which could be microvascular ischemic or inflammatory. Electronically Signed   By: Marnee Spring M.D.   On: 02/20/2017 13:39    Procedures Procedures (including critical care time)  Medications Ordered in ED Medications  sodium chloride 0.9 % bolus 1,000 mL (not administered)  prochlorperazine (COMPAZINE) injection 10 mg (not administered)  diphenhydrAMINE (BENADRYL) injection 25 mg (not administered)  ketorolac (TORADOL) 30 MG/ML injection 30 mg (not administered)     Initial Impression / Assessment and Plan / ED Course  I have reviewed the triage vital signs and the nursing notes.  Pertinent labs & imaging results that were available during my care of the patient were reviewed by me and considered in my medical decision making (see chart for details).  Patient presents for evaluation of headache, also noticed that blood  pressure was elevated to the 150s at home.  Blood pressure has improved here in the ED on its own without intervention and is back to patient's baseline, all other vitals normal.  Patient has had headaches in the past, but this one feels a bit different, but not more severe.  Headache was not at maximal intensity at onset, did not wake patient from sleep, has become slightly worse over the course of the morning.  Not associated with any vision changes, dizziness, neck pain, fevers, non concerning for [facility], ICH, Meningitis, or temporal arteritis. Pt is afebrile with no focal neuro deficits, nuchal rigidity, or change in vision.   Given that headache was a bit different than headaches patient has had in the past, CT head and  basic labs ordered.  Basic labs are unremarkable, no leukocytosis, hemoglobin is normal, no acute metabolic derangements.  CT head shows no acute intracranial abnormalities to suggest infarct, hemorrhage, hydrocephalus or masses.  There are some low-density areas noted in the white matter, which could be consistent with microvascular ischemia versus inflammation given patient does not have a history of hypertension, low likelihood of ischemia.  Discussed these results with Dr. Charm Barges, given that patient headache has completely resolved here in the ED, and neurologic exam remains normal, did not feel that this requires acute intervention, this could be seen from inflammation or migraine.  Will have patient follow-up with neurology regarding this finding as well as his primary care doctor.  Strict return precautions discussed with patient at this time he is stable for discharge home, patient expresses understanding and is in agreement with plan.  Patient discussed with Dr. Charm Barges who is in agreement with plan  Final Clinical Impressions(s) / ED Diagnoses   Final diagnoses:  Bad headache    ED Discharge Orders    None       Dartha Lodge, [place] 02/20/17 1851    Terrilee Files, MD 02/21/17 1556

## 2017-02-21 NOTE — Progress Notes (Signed)
HPI:   [name] is a 37 y.o. male, who is here today to follow on recent ER visit.   He was seen on 02/20/17 in the ER due to headache. He thought headache was related to a acute viral illness. No sick contacts or recent travel.  He was mainly concerned about elevated BP. BP 150's/90-117 and headaches for a week before he went to the ER.   Intermittently, not daily. He was waking up with headache but not waking up because the headache. Frontal,parietal,and sometimes retroocular.  ER visit 8/10 and usually 5/10. Tylenol 500 mg , helped some. Headache resolved spontaneously around noon.  He is not sure about exacerbating or alleviating factors.  No prior Hx of headaches. No associated fever,chills, visual changes, photophobia, nausea, vomiting, numbness, tingling,or focal deficit.  He did not have headache yesterday. Today he has a "minor" headache.    Head CT 02/20/17:  1. No acute finding. 2. Mild patchy low-density in the cerebral white matter which could be microvascular ischemic or inflammatory.    Lab Results  Component Value Date   WBC 5.4 02/20/2017   HGB 14.7 02/20/2017   HCT 44.0 02/20/2017   MCV 78.6 02/20/2017   PLT 265 02/20/2017   Lab Results  Component Value Date   CREATININE 0.92 02/20/2017   BUN 11 02/20/2017   NA 139 02/20/2017   K 4.1 02/20/2017   CL 102 02/20/2017   CO2 29 02/20/2017    Last week work was more stressful.    Review of Systems  Constitutional: Negative for activity change, appetite change, fatigue and fever.  HENT: Negative for mouth sores, nosebleeds, sore throat and trouble swallowing.   Eyes: Negative for redness and visual disturbance.  Respiratory: Negative for cough, shortness of breath and wheezing.   Cardiovascular: Negative for chest pain, palpitations and leg swelling.  Gastrointestinal: Negative for abdominal pain, nausea and vomiting.  Endocrine: Negative for cold intolerance, heat intolerance,  polydipsia, polyphagia and polyuria.  Genitourinary: Negative for decreased urine volume and hematuria.  Musculoskeletal: Negative for gait problem and myalgias.  Skin: Negative for rash and wound.  Allergic/Immunologic: Positive for environmental allergies.  Neurological: Positive for headaches. Negative for dizziness, seizures, syncope, weakness and numbness.  Psychiatric/Behavioral: Negative for confusion. The patient is nervous/anxious.       Current Outpatient Medications on File Prior to Visit  Medication Sig Dispense Refill  . ibuprofen (ADVIL,MOTRIN) 200 MG tablet Take 200 mg by mouth every 6 (six) hours as needed for moderate pain.    Marland Kitchen propranolol (INDERAL) 10 MG tablet Take 1-2 tablets (10-20 mg total) by mouth 2 (two) times daily as needed. 30 tablet 0   No current facility-administered medications on file prior to visit.      Past Medical History:  Diagnosis Date  . Palpitations    Allergies  Allergen Reactions  . Ciprofloxacin Palpitations    Social History   Socioeconomic History  . Marital status: Single    Spouse name: None  . Number of children: None  . Years of education: None  . Highest education level: None  Social Needs  . Financial resource strain: None  . Food insecurity - worry: None  . Food insecurity - inability: None  . Transportation needs - medical: None  . Transportation needs - non-medical: None  Occupational History  . None  Tobacco Use  . Smoking status: Former Research scientist (life sciences)  . Smokeless tobacco: Never Used  Substance and Sexual Activity  .  Alcohol use: No  . Drug use: No  . Sexual activity: None  Other Topics Concern  . None  Social History Narrative  . None    Vitals:   02/22/17 0723  BP: 126/82  Pulse: 76  Resp: 16  Temp: 98.1 F (36.7 C)  SpO2: 99%   Body mass index is 26.79 kg/m.   Physical Exam  Nursing note and vitals reviewed. Constitutional: He is oriented to person, place, and time. He appears  well-developed. No distress.  HENT:  Head: Normocephalic and atraumatic.  Mouth/Throat: Oropharynx is clear and moist and mucous membranes are normal.  Eyes: Conjunctivae and EOM are normal. Pupils are equal, round, and reactive to light.  Neck: No tracheal deviation present. No thyroid mass and no thyromegaly present.  Cardiovascular: Normal rate and regular rhythm.  No murmur heard. Pulses:      Dorsalis pedis pulses are 2+ on the right side, and 2+ on the left side.  Respiratory: Effort normal and breath sounds normal. No respiratory distress.  GI: Soft. He exhibits no mass. There is no hepatomegaly. There is no tenderness.  Musculoskeletal: He exhibits no edema or tenderness.  Lymphadenopathy:    He has no cervical adenopathy.  Neurological: He is alert and oriented to person, place, and time. He has normal strength. No cranial nerve deficit. Gait normal.  Reflex Scores:      Bicep reflexes are 2+ on the right side and 2+ on the left side.      Patellar reflexes are 2+ on the right side and 2+ on the left side. Skin: Skin is warm. No rash noted. No erythema.  Psychiatric: He has a normal mood and affect. Cognition and memory are normal.  Well groomed, good eye contact.    ASSESSMENT AND PLAN:  [name] was seen today for follow-up.  Diagnoses and all orders for this visit:  Abnormal CT of the head  Headache, unspecified headache type  Nonintractable headache, unspecified chronicity pattern, unspecified headache type -     MR Brain Wo Contrast; Future  Improving. We discussed possible etiologies,?  Tension headache. We discussed a few options today, including neurology evaluation and/or brain MRI.  He would like to hold on neurology referral for now. He agrees with the brain MRI w/o contrast. Prefers to hold on labs, planning on ordering ESR, CRP, urine protein electrophoresis.  We will arrange for lab appointment after brain MRI result is back. He was clearly instructed  about warning signs. He voices understanding.     Dave Blyth G. [place], MD  [facility]. [place] office.

## 2017-02-22 ENCOUNTER — Ambulatory Visit (INDEPENDENT_AMBULATORY_CARE_PROVIDER_SITE_OTHER): Payer: BLUE CROSS/BLUE SHIELD | Admitting: Family Medicine

## 2017-02-22 ENCOUNTER — Encounter: Payer: Self-pay | Admitting: Family Medicine

## 2017-02-22 VITALS — BP 126/82 | HR 76 | Temp 98.1°F | Resp 16 | Wt 181.4 lb

## 2017-02-22 DIAGNOSIS — R930 Abnormal findings on diagnostic imaging of skull and head, not elsewhere classified: Secondary | ICD-10-CM | POA: Diagnosis not present

## 2017-02-22 DIAGNOSIS — R51 Headache: Secondary | ICD-10-CM | POA: Diagnosis not present

## 2017-02-22 DIAGNOSIS — R519 Headache, unspecified: Secondary | ICD-10-CM

## 2017-02-26 DIAGNOSIS — M25662 Stiffness of left knee, not elsewhere classified: Secondary | ICD-10-CM | POA: Diagnosis not present

## 2017-02-26 DIAGNOSIS — R262 Difficulty in walking, not elsewhere classified: Secondary | ICD-10-CM | POA: Diagnosis not present

## 2017-02-26 DIAGNOSIS — M25562 Pain in left knee: Secondary | ICD-10-CM | POA: Diagnosis not present

## 2017-02-26 DIAGNOSIS — M6281 Muscle weakness (generalized): Secondary | ICD-10-CM | POA: Diagnosis not present

## 2017-02-28 ENCOUNTER — Ambulatory Visit
Admission: RE | Admit: 2017-02-28 | Discharge: 2017-02-28 | Disposition: A | Discharge status: Home / Self Care | Payer: BLUE CROSS/BLUE SHIELD | Source: Ambulatory Visit | Attending: Family Medicine | Admitting: Family Medicine

## 2017-02-28 DIAGNOSIS — R519 Headache, unspecified: Secondary | ICD-10-CM

## 2017-02-28 DIAGNOSIS — R51 Headache: Principal | ICD-10-CM

## 2017-03-02 ENCOUNTER — Encounter: Payer: Self-pay | Admitting: Family Medicine

## 2017-03-02 DIAGNOSIS — M25562 Pain in left knee: Secondary | ICD-10-CM | POA: Diagnosis not present

## 2017-03-02 DIAGNOSIS — M6281 Muscle weakness (generalized): Secondary | ICD-10-CM | POA: Diagnosis not present

## 2017-03-02 DIAGNOSIS — M25662 Stiffness of left knee, not elsewhere classified: Secondary | ICD-10-CM | POA: Diagnosis not present

## 2017-03-02 DIAGNOSIS — M7711 Lateral epicondylitis, right elbow: Secondary | ICD-10-CM | POA: Diagnosis not present

## 2017-03-12 ENCOUNTER — Other Ambulatory Visit: Payer: Self-pay | Admitting: *Deleted

## 2017-03-12 ENCOUNTER — Telehealth: Payer: Self-pay | Admitting: *Deleted

## 2017-03-12 DIAGNOSIS — M25562 Pain in left knee: Secondary | ICD-10-CM | POA: Diagnosis not present

## 2017-03-12 DIAGNOSIS — M6281 Muscle weakness (generalized): Secondary | ICD-10-CM | POA: Diagnosis not present

## 2017-03-12 DIAGNOSIS — G44209 Tension-type headache, unspecified, not intractable: Secondary | ICD-10-CM

## 2017-03-12 DIAGNOSIS — M25662 Stiffness of left knee, not elsewhere classified: Secondary | ICD-10-CM | POA: Diagnosis not present

## 2017-03-12 DIAGNOSIS — R262 Difficulty in walking, not elsewhere classified: Secondary | ICD-10-CM | POA: Diagnosis not present

## 2017-03-12 NOTE — Telephone Encounter (Signed)
Copied from CRM [phone]. Topic: Referral - Request >> Mar 12, 2017 12:53 PM Windy KalataMichael, Taylor L, NT wrote: Patient states he wants to be referred to a neurologist for his head pain that Dr. [place] knows about.

## 2017-03-12 NOTE — Telephone Encounter (Signed)
Referral placed for neurology

## 2017-03-16 ENCOUNTER — Ambulatory Visit (INDEPENDENT_AMBULATORY_CARE_PROVIDER_SITE_OTHER): Payer: BLUE CROSS/BLUE SHIELD | Admitting: Neurology

## 2017-03-16 ENCOUNTER — Encounter: Payer: Self-pay | Admitting: Neurology

## 2017-03-16 VITALS — BP 108/76 | HR 81 | Ht 69.0 in | Wt 182.8 lb

## 2017-03-16 DIAGNOSIS — G44219 Episodic tension-type headache, not intractable: Secondary | ICD-10-CM

## 2017-03-16 DIAGNOSIS — R9082 White matter disease, unspecified: Secondary | ICD-10-CM

## 2017-03-16 DIAGNOSIS — M6281 Muscle weakness (generalized): Secondary | ICD-10-CM | POA: Diagnosis not present

## 2017-03-16 DIAGNOSIS — R262 Difficulty in walking, not elsewhere classified: Secondary | ICD-10-CM | POA: Diagnosis not present

## 2017-03-16 DIAGNOSIS — M25662 Stiffness of left knee, not elsewhere classified: Secondary | ICD-10-CM | POA: Diagnosis not present

## 2017-03-16 DIAGNOSIS — M25562 Pain in left knee: Secondary | ICD-10-CM | POA: Diagnosis not present

## 2017-03-16 NOTE — Patient Instructions (Signed)
1.  We will check some blood work:  ANA, Sed Rate, Sjogren's antibodies, ANCA 2.  We will also check MRI of cervical spine with and without contrast 3.  If testing is negative, I would recommend that we repeat MRI of brain in one year (or sooner if you experience any new symptoms)

## 2017-03-16 NOTE — Progress Notes (Signed)
NEUROLOGY CONSULTATION NOTE  Dave Favaiman Wymore MRN: [id number] DOB: 09-09-1980  Referring provider: Dr. [place] Primary care provider: Dr. [place]  Reason for consult:  Headache, abnormal brain MRI  HISTORY OF PRESENT ILLNESS: Dave Luna is a 37 year old male who presents for headache.  History supplemented by PCP note.  He has longstanding history of mild-moderate non-throbbing bi-frontal headache lasting a couple of hours and occurring 1 to 2 times a week, usually between February and June.  Often he doesn't need to treat with pain relievers.  In January, he had 2 episodes of severe pressure-like headache in the frontal/top of head.  It was not associated with nausea, photophobia, phonophobia, vomiting, visual disturbance, unsteady gait or unilateral numbness or weakness.  Both times, his BP was in the 150s/100s.  He was evaluated in the ED.  He had recent imaging of the head which were personally reviewed.  CT of head from 02/20/17 demonstrated mild patchy hypodensity in the cerebral white matter.  Follow up MRI of brain without contrast demonstrated numerous nonspecific small T2 foci in the cerebral white matter.  He hasn't had any recurrent severe headaches.  He denies prior history of visual disturbance, ataxia or unilateral numbness or weakness.  He denies history of meningitis or encephalitis.  He does not smoke or have cerebrovascular risk factors.  He denies family history of neurologic diseases such as MS or early stroke.  02/20/17 BMP:  Na 139, K 4.1, CL 102, CO2 29, glucose 90, BUN 11, Cr 0.92.  PAST MEDICAL HISTORY: Past Medical History:  Diagnosis Date  . Palpitations     PAST SURGICAL HISTORY: Past Surgical History:  Procedure Laterality Date  . ANTERIOR CRUCIATE LIGAMENT REPAIR Left   . None      MEDICATIONS: Current Outpatient Medications on File Prior to Visit  Medication Sig Dispense Refill  . ibuprofen (ADVIL,MOTRIN) 200 MG tablet Take 200 mg by mouth every 6  (six) hours as needed for moderate pain.    Marland Kitchen. propranolol (INDERAL) 10 MG tablet Take 1-2 tablets (10-20 mg total) by mouth 2 (two) times daily as needed. 30 tablet 0   No current facility-administered medications on file prior to visit.     ALLERGIES: Allergies  Allergen Reactions  . Ciprofloxacin Palpitations    FAMILY HISTORY: Family History  Problem Relation Age of Onset  . Breast cancer Mother   . Atrial fibrillation Father   . Heart disease Maternal Uncle   . Hypertension Maternal Uncle   . Diabetes Paternal Grandfather   . Diabetes Maternal Uncle   . Diabetes Paternal Uncle     SOCIAL HISTORY: Social History   Socioeconomic History  . Marital status: Single    Spouse name: Not on file  . Number of children: 0  . Years of education: Not on file  . Highest education level: Master's degree (e.g., MA, MS, MEng, MEd, MSW, MBA)  Social Needs  . Financial resource strain: Not on file  . Food insecurity - worry: Not on file  . Food insecurity - inability: Not on file  . Transportation needs - medical: Not on file  . Transportation needs - non-medical: Not on file  Occupational History  . Occupation: Scientist, clinical (histocompatibility and immunogenetics)BI Analyst    Employer: [employer] - [employer]  Tobacco Use  . Smoking status: Former Games developermoker  . Smokeless tobacco: Never Used  Substance and Sexual Activity  . Alcohol use: No  . Drug use: No  . Sexual activity: Not on file  Other Topics  Concern  . Not on file  Social History Narrative   Patient is a single right-handed male. He drinks one cup of coffee and tea a day. He exercises 3-4 x a week. He has a 2nd floor apartment.    REVIEW OF SYSTEMS: Constitutional: No fevers, chills, or sweats, no generalized fatigue, change in appetite Eyes: No visual changes, double vision, eye pain Ear, nose and throat: No hearing loss, ear pain, nasal congestion, sore throat Cardiovascular: No chest pain, palpitations Respiratory:  No shortness of breath at rest or with  exertion, wheezes GastrointestinaI: No nausea, vomiting, diarrhea, abdominal pain, fecal incontinence Genitourinary:  No dysuria, urinary retention or frequency Musculoskeletal:  No neck pain, back pain Integumentary: No rash, pruritus, skin lesions Neurological: as above Psychiatric: No depression, insomnia, anxiety Endocrine: No palpitations, fatigue, diaphoresis, mood swings, change in appetite, change in weight, increased thirst Hematologic/Lymphatic:  No purpura, petechiae. Allergic/Immunologic: no itchy/runny eyes, nasal congestion, recent allergic reactions, rashes  PHYSICAL EXAM: Vitals:   03/16/17 1458  BP: 108/76  Pulse: 81  SpO2: 97%   General: No acute distress.  Patient appears well-groomed.  Head:  Normocephalic/atraumatic Eyes:  fundi examined but not visualized Neck: supple, no paraspinal tenderness, full range of motion Back: No paraspinal tenderness Heart: regular rate and rhythm Lungs: Clear to auscultation bilaterally. Vascular: No carotid bruits. Neurological Exam: Mental status: alert and oriented to person, place, and time, recent and remote memory intact, fund of knowledge intact, attention and concentration intact, speech fluent and not dysarthric, language intact. Cranial nerves: CN I: not tested CN II: pupils equal, round and reactive to light, visual fields intact CN III, IV, VI:  full range of motion, no nystagmus, no ptosis CN V: facial sensation intact CN VII: upper and lower face symmetric CN VIII: hearing intact CN IX, X: gag intact, uvula midline CN XI: sternocleidomastoid and trapezius muscles intact CN XII: tongue midline Bulk & Tone: normal, no fasciculations. Motor:  5/5 throughout  Sensation: temperature and vibration sensation intact. Deep Tendon Reflexes:  2+ throughout, toes downgoing.  Finger to nose testing:  Without dysmetria.  Heel to shin:  Without dysmetria.  Gait:  Normal station and stride.  Able to turn and tandem walk.  Romberg negative.  IMPRESSION: 1.  Abnormal white matter changes on brain MRI.  Nonspecific findings.  More numerous for age in a gentleman without stroke risk factors.  He seems to have history of tension-type headaches but not migraines.  I think findings are more numerous than would be expected in a migraine patient.  Appearance isn't classic for multiple sclerosis and he does not exhibit history of abnormal neurologic exam to suggest demyelinating disease.  Ultimately, it may be of unknown clinical significance. 2.  Tension type headaches.  Severe headaches from last month may have been severe tension type headache or migraine.  Elevated blood pressure may have been secondary to pain (he has no history of hypertension)  PLAN: 1.  To further evaluate MRI findings, I will check blood work for possible autoimmune/inflammatory conditions (ANA, Sed Rate, SSA/SSB antibodies, ANCA). 2.  I will also check MRI or cervical spine with and without contrast to evaluate for any cord lesions.  If unremarkable, then I would repeat MRI of brain without AND WITH CONTRAST in one year. 3.  Headaches are infrequent and manageable.  No further medication management needed.  Thank you for allowing me to take part in the care of this patient.  Shon Millet, DO  CC:  Kathie Rhodes  [place], MD

## 2017-03-17 ENCOUNTER — Other Ambulatory Visit: Payer: BLUE CROSS/BLUE SHIELD

## 2017-03-17 DIAGNOSIS — R9082 White matter disease, unspecified: Secondary | ICD-10-CM

## 2017-03-17 NOTE — Progress Notes (Signed)
[facility], sed-rate was not drawn on Pt. Pt was called and is returning for draw. Sed-rate may be delayed.

## 2017-03-18 LAB — ANCA SCREEN W REFLEX TITER: ANCA Screen: NEGATIVE

## 2017-03-18 LAB — SJOGREN'S SYNDROME ANTIBODS(SSA + SSB)
SSA (Ro) (ENA) Antibody, IgG: <1 AI
SSB (La) (ENA) Antibody, IgG: <1 AI

## 2017-03-18 LAB — ANA: Anti Nuclear Antibody(ANA): NEGATIVE

## 2017-03-18 LAB — SEDIMENTATION RATE: Sed Rate: 2 mm/h (ref 0–15)

## 2017-03-19 ENCOUNTER — Telehealth: Payer: Self-pay

## 2017-03-19 NOTE — Telephone Encounter (Signed)
Called and spoke with Pt, advsd of lab results 

## 2017-03-19 NOTE — Telephone Encounter (Signed)
-----   Message from Drema DallasAdam R Jaffe, DO sent at 03/19/2017  7:04 AM EST ----- Labs are normal.

## 2017-03-26 DIAGNOSIS — M25562 Pain in left knee: Secondary | ICD-10-CM | POA: Diagnosis not present

## 2017-03-26 DIAGNOSIS — R262 Difficulty in walking, not elsewhere classified: Secondary | ICD-10-CM | POA: Diagnosis not present

## 2017-03-26 DIAGNOSIS — M25662 Stiffness of left knee, not elsewhere classified: Secondary | ICD-10-CM | POA: Diagnosis not present

## 2017-03-26 DIAGNOSIS — M6281 Muscle weakness (generalized): Secondary | ICD-10-CM | POA: Diagnosis not present

## 2017-03-28 ENCOUNTER — Other Ambulatory Visit: Payer: BLUE CROSS/BLUE SHIELD

## 2017-03-29 DIAGNOSIS — M25562 Pain in left knee: Secondary | ICD-10-CM | POA: Diagnosis not present

## 2017-03-29 DIAGNOSIS — M25662 Stiffness of left knee, not elsewhere classified: Secondary | ICD-10-CM | POA: Diagnosis not present

## 2017-03-29 DIAGNOSIS — M6281 Muscle weakness (generalized): Secondary | ICD-10-CM | POA: Diagnosis not present

## 2017-03-29 DIAGNOSIS — R262 Difficulty in walking, not elsewhere classified: Secondary | ICD-10-CM | POA: Diagnosis not present

## 2017-04-03 ENCOUNTER — Ambulatory Visit
Admission: RE | Admit: 2017-04-03 | Discharge: 2017-04-03 | Disposition: A | Discharge status: Home / Self Care | Payer: BLUE CROSS/BLUE SHIELD | Source: Ambulatory Visit | Attending: Neurology | Admitting: Neurology

## 2017-04-03 DIAGNOSIS — M50223 Other cervical disc displacement at C6-C7 level: Secondary | ICD-10-CM | POA: Diagnosis not present

## 2017-04-03 DIAGNOSIS — M50222 Other cervical disc displacement at C5-C6 level: Secondary | ICD-10-CM | POA: Diagnosis not present

## 2017-04-03 DIAGNOSIS — R9082 White matter disease, unspecified: Secondary | ICD-10-CM

## 2017-04-03 DIAGNOSIS — M4802 Spinal stenosis, cervical region: Secondary | ICD-10-CM | POA: Diagnosis not present

## 2017-04-03 MED ORDER — GADOBENATE DIMEGLUMINE 529 MG/ML IV SOLN
17.0000 mL | Freq: Once | INTRAVENOUS | Status: AC | PRN
  Administered 2017-04-03: 17 mL via INTRAVENOUS

## 2017-04-05 ENCOUNTER — Telehealth: Payer: Self-pay | Admitting: *Deleted

## 2017-04-05 DIAGNOSIS — M25662 Stiffness of left knee, not elsewhere classified: Secondary | ICD-10-CM | POA: Diagnosis not present

## 2017-04-05 DIAGNOSIS — M25562 Pain in left knee: Secondary | ICD-10-CM | POA: Diagnosis not present

## 2017-04-05 DIAGNOSIS — M6281 Muscle weakness (generalized): Secondary | ICD-10-CM | POA: Diagnosis not present

## 2017-04-05 DIAGNOSIS — R262 Difficulty in walking, not elsewhere classified: Secondary | ICD-10-CM | POA: Diagnosis not present

## 2017-04-05 NOTE — Telephone Encounter (Signed)
Left message giving patient results and instructions.   

## 2017-04-05 NOTE — Telephone Encounter (Signed)
-----   Message from Drema DallasAdam R Jaffe, DO sent at 04/05/2017  1:44 PM EDT ----- MRI of cervical spine looks okay.  There is a mild disc bulge but nothing significant.  I would like to repeat MRI of brain with and without contrast in one year and follow up with me afterward

## 2017-04-06 DIAGNOSIS — M25562 Pain in left knee: Secondary | ICD-10-CM | POA: Diagnosis not present

## 2017-04-08 DIAGNOSIS — M6281 Muscle weakness (generalized): Secondary | ICD-10-CM | POA: Diagnosis not present

## 2017-04-08 DIAGNOSIS — M25562 Pain in left knee: Secondary | ICD-10-CM | POA: Diagnosis not present

## 2017-04-08 DIAGNOSIS — R262 Difficulty in walking, not elsewhere classified: Secondary | ICD-10-CM | POA: Diagnosis not present

## 2017-04-08 DIAGNOSIS — M25662 Stiffness of left knee, not elsewhere classified: Secondary | ICD-10-CM | POA: Diagnosis not present

## 2017-04-13 DIAGNOSIS — M6281 Muscle weakness (generalized): Secondary | ICD-10-CM | POA: Diagnosis not present

## 2017-04-13 DIAGNOSIS — R262 Difficulty in walking, not elsewhere classified: Secondary | ICD-10-CM | POA: Diagnosis not present

## 2017-04-13 DIAGNOSIS — M25662 Stiffness of left knee, not elsewhere classified: Secondary | ICD-10-CM | POA: Diagnosis not present

## 2017-04-13 DIAGNOSIS — M25562 Pain in left knee: Secondary | ICD-10-CM | POA: Diagnosis not present

## 2017-04-22 DIAGNOSIS — R262 Difficulty in walking, not elsewhere classified: Secondary | ICD-10-CM | POA: Diagnosis not present

## 2017-04-22 DIAGNOSIS — M25662 Stiffness of left knee, not elsewhere classified: Secondary | ICD-10-CM | POA: Diagnosis not present

## 2017-04-22 DIAGNOSIS — M6281 Muscle weakness (generalized): Secondary | ICD-10-CM | POA: Diagnosis not present

## 2017-04-22 DIAGNOSIS — M25562 Pain in left knee: Secondary | ICD-10-CM | POA: Diagnosis not present

## 2017-04-28 DIAGNOSIS — M25662 Stiffness of left knee, not elsewhere classified: Secondary | ICD-10-CM | POA: Diagnosis not present

## 2017-04-28 DIAGNOSIS — R262 Difficulty in walking, not elsewhere classified: Secondary | ICD-10-CM | POA: Diagnosis not present

## 2017-04-28 DIAGNOSIS — M6281 Muscle weakness (generalized): Secondary | ICD-10-CM | POA: Diagnosis not present

## 2017-04-28 DIAGNOSIS — M25562 Pain in left knee: Secondary | ICD-10-CM | POA: Diagnosis not present

## 2017-05-05 DIAGNOSIS — R262 Difficulty in walking, not elsewhere classified: Secondary | ICD-10-CM | POA: Diagnosis not present

## 2017-05-05 DIAGNOSIS — M25562 Pain in left knee: Secondary | ICD-10-CM | POA: Diagnosis not present

## 2017-05-05 DIAGNOSIS — M25662 Stiffness of left knee, not elsewhere classified: Secondary | ICD-10-CM | POA: Diagnosis not present

## 2017-05-05 DIAGNOSIS — M6281 Muscle weakness (generalized): Secondary | ICD-10-CM | POA: Diagnosis not present

## 2017-05-18 DIAGNOSIS — M25562 Pain in left knee: Secondary | ICD-10-CM | POA: Diagnosis not present

## 2017-05-20 DIAGNOSIS — R262 Difficulty in walking, not elsewhere classified: Secondary | ICD-10-CM | POA: Diagnosis not present

## 2017-05-20 DIAGNOSIS — M25562 Pain in left knee: Secondary | ICD-10-CM | POA: Diagnosis not present

## 2017-05-20 DIAGNOSIS — M25662 Stiffness of left knee, not elsewhere classified: Secondary | ICD-10-CM | POA: Diagnosis not present

## 2017-05-20 DIAGNOSIS — M6281 Muscle weakness (generalized): Secondary | ICD-10-CM | POA: Diagnosis not present

## 2017-05-29 DIAGNOSIS — Z20818 Contact with and (suspected) exposure to other bacterial communicable diseases: Secondary | ICD-10-CM | POA: Diagnosis not present

## 2017-06-01 DIAGNOSIS — M25662 Stiffness of left knee, not elsewhere classified: Secondary | ICD-10-CM | POA: Diagnosis not present

## 2017-06-01 DIAGNOSIS — M6281 Muscle weakness (generalized): Secondary | ICD-10-CM | POA: Diagnosis not present

## 2017-06-01 DIAGNOSIS — M25562 Pain in left knee: Secondary | ICD-10-CM | POA: Diagnosis not present

## 2017-06-01 DIAGNOSIS — R262 Difficulty in walking, not elsewhere classified: Secondary | ICD-10-CM | POA: Diagnosis not present

## 2017-06-15 DIAGNOSIS — M25662 Stiffness of left knee, not elsewhere classified: Secondary | ICD-10-CM | POA: Diagnosis not present

## 2017-06-15 DIAGNOSIS — M25562 Pain in left knee: Secondary | ICD-10-CM | POA: Diagnosis not present

## 2017-06-15 DIAGNOSIS — M6281 Muscle weakness (generalized): Secondary | ICD-10-CM | POA: Diagnosis not present

## 2017-06-15 DIAGNOSIS — R262 Difficulty in walking, not elsewhere classified: Secondary | ICD-10-CM | POA: Diagnosis not present

## 2017-06-24 DIAGNOSIS — R262 Difficulty in walking, not elsewhere classified: Secondary | ICD-10-CM | POA: Diagnosis not present

## 2017-06-24 DIAGNOSIS — M25662 Stiffness of left knee, not elsewhere classified: Secondary | ICD-10-CM | POA: Diagnosis not present

## 2017-06-24 DIAGNOSIS — M6281 Muscle weakness (generalized): Secondary | ICD-10-CM | POA: Diagnosis not present

## 2017-06-24 DIAGNOSIS — M25562 Pain in left knee: Secondary | ICD-10-CM | POA: Diagnosis not present

## 2017-06-28 DIAGNOSIS — M25562 Pain in left knee: Secondary | ICD-10-CM | POA: Diagnosis not present

## 2017-06-28 DIAGNOSIS — R262 Difficulty in walking, not elsewhere classified: Secondary | ICD-10-CM | POA: Diagnosis not present

## 2017-06-28 DIAGNOSIS — M25662 Stiffness of left knee, not elsewhere classified: Secondary | ICD-10-CM | POA: Diagnosis not present

## 2017-06-28 DIAGNOSIS — M6281 Muscle weakness (generalized): Secondary | ICD-10-CM | POA: Diagnosis not present

## 2017-06-29 DIAGNOSIS — S83512A Sprain of anterior cruciate ligament of left knee, initial encounter: Secondary | ICD-10-CM | POA: Diagnosis not present

## 2017-09-30 DIAGNOSIS — S83512D Sprain of anterior cruciate ligament of left knee, subsequent encounter: Secondary | ICD-10-CM | POA: Diagnosis not present

## 2017-10-03 DIAGNOSIS — R509 Fever, unspecified: Secondary | ICD-10-CM | POA: Diagnosis not present

## 2017-10-03 DIAGNOSIS — J029 Acute pharyngitis, unspecified: Secondary | ICD-10-CM | POA: Diagnosis not present

## 2017-10-31 DIAGNOSIS — M25562 Pain in left knee: Secondary | ICD-10-CM | POA: Diagnosis not present

## 2018-01-27 ENCOUNTER — Telehealth: Payer: Self-pay | Admitting: Neurology

## 2018-01-27 DIAGNOSIS — R9082 White matter disease, unspecified: Secondary | ICD-10-CM

## 2018-01-27 NOTE — Telephone Encounter (Signed)
Patient is calling in about a MRI that needed to ordered. He was also wanting to know if he needs to schedule an appt before the next MRI. Please call him back at [phone]. Thanks!

## 2018-01-31 NOTE — Telephone Encounter (Signed)
Called and advised Pt I am sending orders for MRI, provided him the contact information for [facility] to call to schedule MRI in early March. Made f/u OV appt later in March.

## 2018-02-12 DIAGNOSIS — J019 Acute sinusitis, unspecified: Secondary | ICD-10-CM | POA: Diagnosis not present

## 2018-03-08 ENCOUNTER — Telehealth: Payer: Self-pay | Admitting: Neurology

## 2018-03-08 NOTE — Telephone Encounter (Signed)
Patient returned call to the office regarding insurance. Thanks

## 2018-03-08 NOTE — Telephone Encounter (Signed)
Called Pt, LMOVM advisng we have not contacted him, he must have the incirrect office, but to call back if he still needs to speak with me.

## 2018-03-23 ENCOUNTER — Ambulatory Visit (INDEPENDENT_AMBULATORY_CARE_PROVIDER_SITE_OTHER): Payer: BLUE CROSS/BLUE SHIELD | Admitting: Family Medicine

## 2018-03-23 ENCOUNTER — Encounter: Payer: Self-pay | Admitting: Family Medicine

## 2018-03-23 DIAGNOSIS — Z13220 Encounter for screening for lipoid disorders: Secondary | ICD-10-CM | POA: Diagnosis not present

## 2018-03-23 DIAGNOSIS — Z130 Encounter for screening for diseases of the blood and blood-forming organs and certain disorders involving the immune mechanism: Secondary | ICD-10-CM

## 2018-03-23 DIAGNOSIS — Z13228 Encounter for screening for other metabolic disorders: Secondary | ICD-10-CM

## 2018-03-23 DIAGNOSIS — Z0000 Encounter for general adult medical examination without abnormal findings: Secondary | ICD-10-CM | POA: Diagnosis not present

## 2018-03-23 DIAGNOSIS — Z1329 Encounter for screening for other suspected endocrine disorder: Secondary | ICD-10-CM | POA: Diagnosis not present

## 2018-03-23 DIAGNOSIS — J309 Allergic rhinitis, unspecified: Secondary | ICD-10-CM | POA: Diagnosis not present

## 2018-03-23 DIAGNOSIS — J3089 Other allergic rhinitis: Secondary | ICD-10-CM | POA: Insufficient documentation

## 2018-03-23 LAB — BASIC METABOLIC PANEL
BUN: 13 mg/dL (ref 6–23)
CO2: 31 mEq/L (ref 19–32)
Calcium: 9.7 mg/dL (ref 8.4–10.5)
Chloride: 102 mEq/L (ref 96–112)
Creatinine, Ser: 0.9 mg/dL (ref 0.40–1.50)
GFR: 95.19 mL/min (ref >60.00)
Glucose, Bld: 82 mg/dL (ref 70–99)
Potassium: 4.6 mEq/L (ref 3.5–5.1)
Sodium: 140 mEq/L (ref 135–145)

## 2018-03-23 LAB — LIPID PANEL
Cholesterol: 184 mg/dL (ref 0–200)
HDL: 41 mg/dL (ref >39.00)
LDL Cholesterol: 117 mg/dL — ABNORMAL HIGH (ref 0–99)
NonHDL: 143.22
Total CHOL/HDL Ratio: 4
Triglycerides: 132 mg/dL (ref 0.0–149.0)
VLDL: 26.4 mg/dL (ref 0.0–40.0)

## 2018-03-23 LAB — HEMOGLOBIN A1C: Hgb A1c MFr Bld: 5.3 % (ref 4.6–6.5)

## 2018-03-23 MED ORDER — MOMETASONE FUROATE 50 MCG/ACT NA SUSP: 2.0000 | Freq: Every day | NASAL | 6 refills | Status: DC

## 2018-03-23 NOTE — Assessment & Plan Note (Signed)
Recommend nasal irrigation with saline a few times per day and as needed. Stop Flonase. He will try Nasonex nasal spray, side effect discussed.

## 2018-03-23 NOTE — Patient Instructions (Signed)
A few things to remember from today's visit:   Routine general medical examination at a health care facility  Screening for lipoid disorders - Plan: Basic metabolic panel, Hemoglobin A1c  Screening for endocrine, metabolic and immunity disorder - Plan: Lipid panel  Allergic rhinitis, unspecified seasonality, unspecified trigger   At least 150 minutes of moderate exercise per week, daily brisk walking for 15-30 min is a good exercise option. Healthy diet low in saturated (animal) fats and sweets and consisting of fresh fruits and vegetables, lean meats such as fish and white chicken and whole grains.  - Vaccines:  Tdap vaccine every 10 years.  Shingles vaccine recommended at age 37, could be given after 37 years of age but not sure about insurance coverage.  Pneumonia vaccines:  Prevnar 13 at 65 and Pneumovax at 39.   -Screening recommendations for low/normal risk males:  Screening for diabetes at age 60 and every 3 years. Earlier screening if cardiovascular risk factors.   Lipid screening at 35 and every 3 years. Screening starts in younger males with cardiovascular risk factors.  Colon cancer screening at age 15 and until age 71.  Prostate cancer screening: some controversy, starts usually at 50: Rectal exam and PSA.  Aortic Abdominal Aneurism once between 42 and 52 years old if ever smoker.  Also recommended:  1. Dental visit- Brush and floss your teeth twice daily; visit your dentist twice a year. 2. Eye doctor- Get an eye exam at least every 2 years. 3. Helmet use- Always wear a helmet when riding a bicycle, motorcycle, rollerblading or skateboarding. 4. Safe sex- If you may be exposed to sexually transmitted infections, use a condom. 5. Seat belts- Seat belts can save your live; always wear one. 6. Smoke/Carbon Monoxide detectors- These detectors need to be installed on the appropriate level of your home. Replace batteries at least once a year. 7. Skin cancer- When  out in the sun please cover up and use sunscreen 15 SPF or higher. 8. Violence- If anyone is threatening or hurting you, please tell your healthcare provider.  9. Drink alcohol in moderation- Limit alcohol intake to one drink or less per day. Never drink and drive.  Please be sure medication list is accurate. If a new problem present, please set up appointment sooner than planned today.

## 2018-03-23 NOTE — Progress Notes (Signed)
HPI:  [name] is a 38 y.o.male here today for his routine physical examination.  Last CPE: 2015 He lives alone.  Regular exercise 3 or more times per week: At least 5 times per week. Following a healthy diet: Yes.He usually cooks.   Chronic medical problems: Arrhythmia,headaches,and allergic rhinitis among some. He is not sexually active. Hx of STD's: Denies   There is no immunization history on file for this patient.   -Denies high alcohol intake, tobacco use, or Hx of illicit drug use.  He would like to have labs done today. In a few days she is having wisdom teeth surgery. He will be following with neurologist, Dr. Ricka Burdock, in 03/2018.  Allergy rhinitis, usually worse during the spring. Currently he has mild rhinorrhea. He uses Flonase as needed but he does not seem to help much. OTC antihistamine like Zyrtec causes arrhythmias.    Review of Systems  Constitutional: Negative for activity change, appetite change, fatigue and fever.  HENT: Negative for dental problem, nosebleeds, sore throat and trouble swallowing. + Mild nasal congestion. Eyes: Negative for redness and visual disturbance.  Respiratory: Negative for cough, shortness of breath and wheezing.   Cardiovascular: Negative for chest pain, palpitations and leg swelling.  Gastrointestinal: Negative for abdominal pain, blood in stool, nausea and vomiting.  Endocrine: Negative for cold intolerance, heat intolerance, polydipsia, polyphagia and polyuria.  Genitourinary: Negative for decreased urine volume, discharge, dysuria, genital sores, hematuria and testicular pain.  Musculoskeletal: Negative for gait problem and myalgias.  Skin: Negative for color change and rash.  Allergic/Immunologic: + Environmental allergies.  Neurological: Negative for syncope, weakness and headaches.  Hematological: Negative for adenopathy. Does not bruise/bleed easily.  Psychiatric/Behavioral: Negative for confusion. The patient is  not nervous/anxious.   All other systems reviewed and are negative.   Current Outpatient Medications on File Prior to Visit  Medication Sig Dispense Refill  . ibuprofen (ADVIL,MOTRIN) 200 MG tablet Take 200 mg by mouth every 6 (six) hours as needed for moderate pain.    Marland Kitchen propranolol (INDERAL) 10 MG tablet Take 1-2 tablets (10-20 mg total) by mouth 2 (two) times daily as needed. (Patient not taking: Reported on 03/23/2018) 30 tablet 0   No current facility-administered medications on file prior to visit.    Past Medical History:  Diagnosis Date  . Palpitations    Past Surgical History:  Procedure Laterality Date  . ANTERIOR CRUCIATE LIGAMENT REPAIR Left   . None     Allergies  Allergen Reactions  . Ciprofloxacin Palpitations    Family History  Problem Relation Age of Onset  . Breast cancer Mother   . Atrial fibrillation Father   . Heart disease Maternal Uncle   . Hypertension Maternal Uncle   . Diabetes Paternal Grandfather   . Diabetes Maternal Uncle   . Diabetes Paternal Uncle    Social History   Socioeconomic History  . Marital status: Single    Spouse name: Not on file  . Number of children: 0  . Years of education: Not on file  . Highest education level: Master's degree (e.g., MA, MS, MEng, MEd, MSW, MBA)  Occupational History  . 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 Financial resource strain: Not on file  . Food insecurity:    Worry: Not on file    Inability: Not on file  . Transportation needs:    Medical: Not on file    Non-medical: Not on file  Tobacco Use  . Smoking status: Former Games developer  . Smokeless tobacco: Never Used  Substance and Sexual Activity  . Alcohol use: No  . Drug use: No  . Sexual activity: Not on file  Lifestyle  . Physical activity:    Days per week: Not on file    Minutes per session: Not on file  . Stress: Not on file  Relationships  . Social connections:    Talks on phone: Not on  file    Gets together: Not on file    Attends religious service: Not on file    Active member of club or organization: Not on file    Attends meetings of clubs or organizations: Not on file    Relationship status: Not on file  Other Topics Concern  . Not on file  Social History Narrative   Patient is a single right-handed male. He drinks one cup of coffee and tea a day. He exercises 3-4 x a week. He has a 2nd floor apartment.    Today's Vitals   03/23/18 0747  BP: 120/70  Pulse: 72  Temp: 98 F (36.7 C)  TempSrc: Oral  SpO2: 99%  Weight: 184 lb 4 oz (83.6 kg)  Height: 5\' 9"  (1.753 m)   Body mass index is 27.21 kg/m.  Wt Readings from Last 3 Encounters:  03/23/18 184 lb 4 oz (83.6 kg)  03/16/17 182 lb 12.8 oz (82.9 kg)  02/22/17 181 lb 6.4 oz (82.3 kg)     Physical Exam  Nursing note and vitals reviewed. Constitutional: He is oriented to person, place, and time. He appears well-developed. No distress.  HENT:  Head: Normocephalic and atraumatic.  Right Ear: Tympanic membrane, external ear and ear canal normal.  Left Ear: Tympanic membrane, external ear and ear canal normal.  Mouth/Throat: Oropharynx is clear and moist and mucous membranes are normal.  Eyes: Pupils are equal, round, and reactive to light. Conjunctivae and EOM are normal.  Neck: Normal range of motion. No tracheal deviation present. No thyromegaly present.  Cardiovascular: Normal rate and regular rhythm.  No murmur heard. Pulses:      Dorsalis pedis pulses are 2+ on the right side and 2+ on the left side.  Respiratory: Effort normal and breath sounds normal. No respiratory distress.  GI: Soft. He exhibits no mass. There is no hepatomegaly. There is no abdominal tenderness.  Genitourinary:    Genitourinary Comments: No concerns.  Musculoskeletal:        General: No tenderness or edema.     Comments: No major deformities appreciated and no signs of synovitis.  Lymphadenopathy:    He has no cervical  adenopathy.       Right: No supraclavicular adenopathy present.       Left: No supraclavicular adenopathy present.  Neurological: He is alert and oriented to person, place, and time. He has normal strength. No cranial nerve deficit or sensory deficit. Coordination and gait normal.  Reflex Scores:      Bicep reflexes are 2+ on the right side and 2+ on the left side.      Patellar reflexes are 2+ on the right side and 2+ on the left side. Skin: Skin is warm. No erythema.  Psychiatric: He has a normal mood and affect. Cognition and memory are normal.  Well groomed,good eye contact.   ASSESSMENT AND PLAN:  [name] was here today annual physical examination.  Orders Placed This Encounter  Procedures  . Basic metabolic panel  .  Lipid panel  . Hemoglobin A1c   Lab Results  Component Value Date   HGBA1C 5.3 03/23/2018   Lab Results  Component Value Date   CREATININE 0.90 03/23/2018   BUN 13 03/23/2018   NA 140 03/23/2018   K 4.6 03/23/2018   CL 102 03/23/2018   CO2 31 03/23/2018   Lab Results  Component Value Date   CHOL 184 03/23/2018   HDL 41.00 03/23/2018   LDLCALC 117 (H) 03/23/2018   TRIG 132.0 03/23/2018   CHOLHDL 4 03/23/2018    Routine general medical examination at a health care facility We discussed the importance of regular physical activity and healthy diet for prevention of chronic illness and/or complications. Preventive guidelines reviewed. Vaccination up to date. Next CPE in a year.  Screening for lipoid disorders -     Basic metabolic panel -     Hemoglobin A1c  Screening for endocrine, metabolic and immunity disorder -     Lipid panel   Allergic rhinitis Recommend nasal irrigation with saline a few times per day and as needed. Stop Flonase. He will try Nasonex nasal spray, side effect discussed.   Return in 1 year (on 03/24/2019) for cpe.    Ilia Dimaano G. [place], MD  [facility]. [place] office.

## 2018-04-02 ENCOUNTER — Ambulatory Visit
Admission: RE | Admit: 2018-04-02 | Discharge: 2018-04-02 | Disposition: A | Discharge status: Home / Self Care | Payer: BLUE CROSS/BLUE SHIELD | Source: Ambulatory Visit | Attending: Neurology | Admitting: Neurology

## 2018-04-02 DIAGNOSIS — R9082 White matter disease, unspecified: Secondary | ICD-10-CM | POA: Diagnosis not present

## 2018-04-02 MED ORDER — GADOBENATE DIMEGLUMINE 529 MG/ML IV SOLN
17.0000 mL | Freq: Once | INTRAVENOUS | Status: AC | PRN
  Administered 2018-04-02: 17 mL via INTRAVENOUS

## 2018-04-15 ENCOUNTER — Telehealth: Payer: Self-pay

## 2018-04-15 NOTE — Telephone Encounter (Signed)
Called and advised pt of MRI results.

## 2018-04-15 NOTE — Telephone Encounter (Signed)
-----   Message from Drema Dallas, DO sent at 04/04/2018  8:11 AM EDT -----   MRI of brain is stable, unchanged from MRI last year. We can discuss further at follow up visit.

## 2018-05-17 NOTE — Progress Notes (Signed)
Virtual Visit via Video Note The purpose of this virtual visit is to provide medical care while limiting exposure to the novel coronavirus.    Consent was obtained for video visit:  Yes.   Answered questions that patient had about telehealth interaction:  Yes.   I discussed the limitations, risks, security and privacy concerns of performing an evaluation and management service by telemedicine. I also discussed with the patient that there may be a patient responsible charge related to this service. The patient expressed understanding and agreed to proceed.  Pt location: Home Physician Location: office Name of referring provider:  [place], Betty G, MD I connected with Bonnye Fava at patients initiation/request on 05/18/2018 at 10:10 AM EDT by video enabled telemedicine application and verified that I am speaking with the correct person using two identifiers. Pt MRN:  [id number] Pt DOB:  1981/01/18 Video Participants:  Bonnye Fava   History of Present Illness:  Dave Luna is a 38 year old male who follows up for tension-type headache.  UPDATE: Last seen a year ago.  Underwent workup for abnormal white matter changes on brain MRI.  Vasculitis labs from February 2019, including ANA, Sed Rate, Sjogren's antibodies and ANCA, were unremarkable.  MRI of cervical spine with and without contrast from 04/03/17 were personally reviewed and revealed no abnormality of the spinal cord.  Plan was to repeat MRI in one year.  MRI of brain with and without contrast from 04/03/18 was personally reviewed and again demonstrated nonspecific scattered hyperintense foci in the bilateral cerebral white matter, non-enhancing and stable compared to prior MRI a year ago.  He has had no recurrent headaches.  He denies any neurologic events since last year (such as weakness, numbness, unsteady gait or visual changes)  HISTORY: He has longstanding history of mild-moderate non-throbbing bi-frontal headache lasting a couple of  hours and occurring 1 to 2 times a week, usually between February and June.  Often he doesn't need to treat with pain relievers.  In January 2019, he had 2 episodes of severe pressure-like headache in the frontal/top of head.  It was not associated with nausea, photophobia, phonophobia, vomiting, visual disturbance, unsteady gait or unilateral numbness or weakness.  Both times, his BP was in the 150s/100s.  He was evaluated in the ED.  He had recent imaging of the head which were personally reviewed.  CT of head from 02/20/17 demonstrated mild patchy hypodensity in the cerebral white matter.  Follow up MRI of brain without contrast demonstrated numerous nonspecific small T2 foci in the cerebral white matter.  He hasn't had any recurrent severe headaches.  He denies prior history of visual disturbance, ataxia or unilateral numbness or weakness.  He denies history of meningitis or encephalitis.  He does not smoke or have cerebrovascular risk factors.  He denies family history of neurologic diseases such as MS or early stroke.  Past Medical History: Past Medical History:  Diagnosis Date  . Palpitations     Medications: Outpatient Encounter Medications as of 05/18/2018  Medication Sig  . ibuprofen (ADVIL,MOTRIN) 200 MG tablet Take 200 mg by mouth every 6 (six) hours as needed for moderate pain.  . mometasone (NASONEX) 50 MCG/ACT nasal spray Place 2 sprays into the nose daily.  . propranolol (INDERAL) 10 MG tablet Take 1-2 tablets (10-20 mg total) by mouth 2 (two) times daily as needed. (Patient not taking: Reported on 03/23/2018)   No facility-administered encounter medications on file as of 05/18/2018.     Allergies: Allergies  Allergen Reactions  . Ciprofloxacin Palpitations    Family History: Family History  Problem Relation Age of Onset  . Breast cancer Mother   . Atrial fibrillation Father   . Heart disease Maternal Uncle   . Hypertension Maternal Uncle   . Diabetes Paternal  Grandfather   . Diabetes Maternal Uncle   . Diabetes Paternal Uncle     Social History: Social History   Socioeconomic History  . Marital status: Single    Spouse name: Not on file  . Number of children: 0  . Years of education: Not on file  . Highest education level: Master's degree (e.g., MA, MS, MEng, MEd, MSW, MBA)  Occupational History  . 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 Financial resource strain: Not on file  . Food insecurity:    Worry: Not on file    Inability: Not on file  . Transportation needs:    Medical: Not on file    Non-medical: Not on file  Tobacco Use  . Smoking status: Former Games developer  . Smokeless tobacco: Never Used  Substance and Sexual Activity  . Alcohol use: No  . Drug use: No  . Sexual activity: Not on file  Lifestyle  . Physical activity:    Days per week: Not on file    Minutes per session: Not on file  . Stress: Not on file  Relationships  . Social connections:    Talks on phone: Not on file    Gets together: Not on file    Attends religious service: Not on file    Active member of club or organization: Not on file    Attends meetings of clubs or organizations: Not on file    Relationship status: Not on file  . Intimate partner violence:    Fear of current or ex partner: Not on file    Emotionally abused: Not on file    Physically abused: Not on file    Forced sexual activity: Not on file  Other Topics Concern  . Not on file  Social History Narrative   Patient is a single right-handed male. He drinks one cup of coffee and tea a day. He exercises 3-4 x a week. He has a 2nd floor apartment.    Observations/Objective:   Blood pressure 120/70, pulse 72, temperature (!) 96.8 F (36 C), height 5\' 10"  (1.778 m), weight 182 lb (82.6 kg). No acute distress.  Well-groomed.  Alert and oriented.  Speech fluent and not dysarthric.  Language intact.  Face symmetric.    Assessment and Plan:   1.   Abnormal white matter changes on brain MRI.  Nonspecific findings of unknown clinical significance.  More numerous for age in a gentleman without stroke risk factors.  He seems to have history of tension-type headaches but not migraines.  I think findings are more numerous than would be expected in a migraine patient.  Appearance isn't classic for multiple sclerosis and he does not exhibit history of abnormal neurologic exam to suggest demyelinating disease.  MRI of cervical spine did not reveal any spinal cord lesion to support possible demyelinating disease.  Vasculitis labs were unremarkable.    Given the negative workup, stability of MRI findings, and no red flags on neurologic exam and history, repeat imaging and workup not indicated unless he presents with new/abnormal/lateralizing neurologic symptoms.  Otherwise, follow up as needed.  Follow Up Instructions:    -I discussed the assessment and  treatment plan with the patient. The patient was provided an opportunity to ask questions and all were answered. The patient agreed with the plan and demonstrated an understanding of the instructions.   The patient was advised to call back or seek an in-person evaluation if the symptoms worsen or if the condition fails to improve as anticipated.    Total Time spent in visit with the patient was:  10 minutes.  Cira ServantAdam Robert Jaffe, DO

## 2018-05-18 ENCOUNTER — Encounter: Payer: Self-pay | Admitting: Neurology

## 2018-05-18 ENCOUNTER — Telehealth (INDEPENDENT_AMBULATORY_CARE_PROVIDER_SITE_OTHER): Payer: BLUE CROSS/BLUE SHIELD | Admitting: Neurology

## 2018-05-18 ENCOUNTER — Other Ambulatory Visit: Payer: Self-pay

## 2018-05-18 VITALS — BP 120/70 | HR 72 | Temp 96.8°F | Ht 70.0 in | Wt 182.0 lb

## 2018-05-18 DIAGNOSIS — R9082 White matter disease, unspecified: Secondary | ICD-10-CM

## 2018-05-18 NOTE — Patient Instructions (Signed)
If you experience any new neurologic symptoms, then contact me.  Otherwise, follow up as needed.

## 2018-08-27 HISTORY — PX: WISDOM TOOTH EXTRACTION: SHX21

## 2018-09-28 ENCOUNTER — Ambulatory Visit: Payer: BLUE CROSS/BLUE SHIELD | Admitting: Allergy

## 2018-10-28 ENCOUNTER — Ambulatory Visit: Payer: BLUE CROSS/BLUE SHIELD | Admitting: Allergy

## 2018-10-31 ENCOUNTER — Ambulatory Visit: Payer: BLUE CROSS/BLUE SHIELD | Admitting: Allergy

## 2018-11-09 IMAGING — MR MR HEAD W/O CM
9 series · 48 of 48 positions shown · non-contrast
Comparison: Head CT 02/20/2017

CLINICAL DATA: Headaches for 2 weeks. Pain above the thighs
extending to the base of the skull.

EXAM:
MRI HEAD WITHOUT CONTRAST
TECHNIQUE: Multiplanar, multiecho pulse sequences of the brain and surrounding
structures were obtained without intravenous contrast.

[Series 5: T1 · sagittal · 4.0mm · 0.75mm/px · 2 of 31 slices shown (1 of 2)]
[im 1/31]
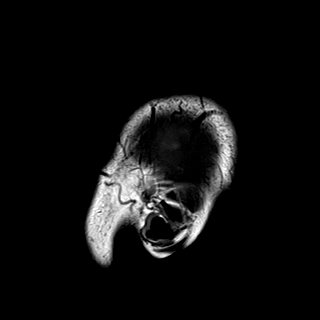
[im 31/31]
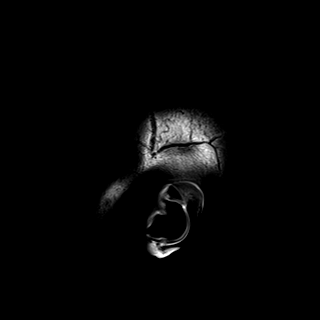

[Series 6: DWI · axial · 3.0mm · 1.44mm/px · z∈[-94,+55]mm · 8 of 92 slices shown (1 of 2)]
[im 1/92]
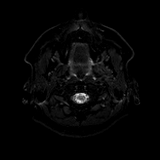
[im 14/92]
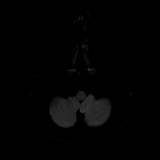
[im 27/92]
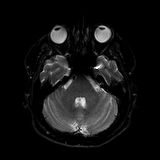
[im 40/92]
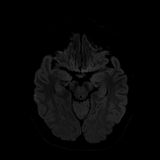
[im 53/92]
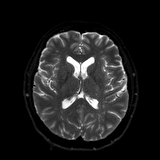
[im 66/92]
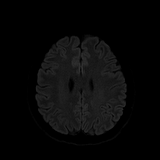
[im 79/92]
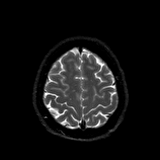
[im 92/92]
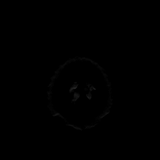

[Series 7: DWI · axial · 3.0mm · 1.44mm/px · z∈[-94,+55]mm · 4 of 43 slices shown (2 of 2)]
[im 1/43]
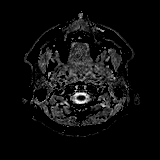
[im 15/43]
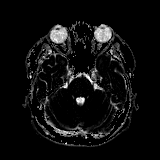
[im 29/43]
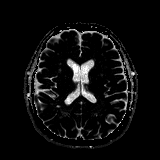
[im 43/43]
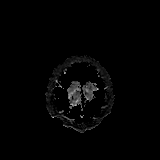

[Series 8: T2 · axial · 4.0mm · 0.36mm/px · z∈[-91,+59]mm · 3 of 30 slices shown (1 of 2)]
[im 1/30]
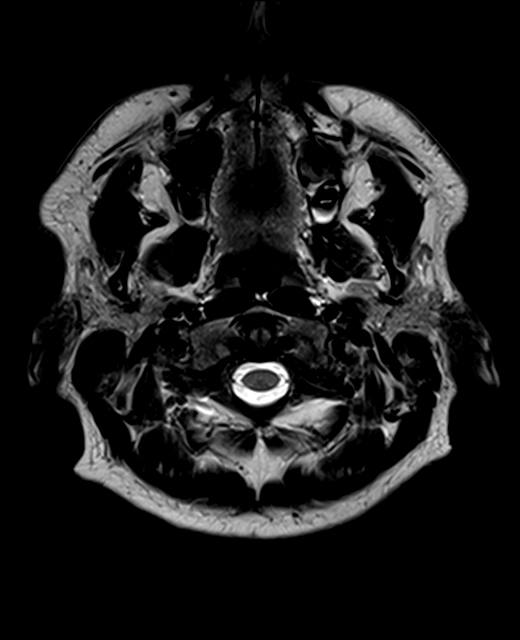
[im 15/30]
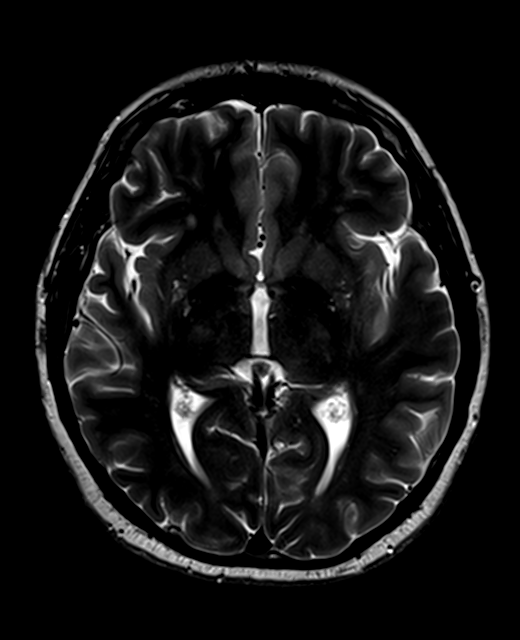
[im 30/30]
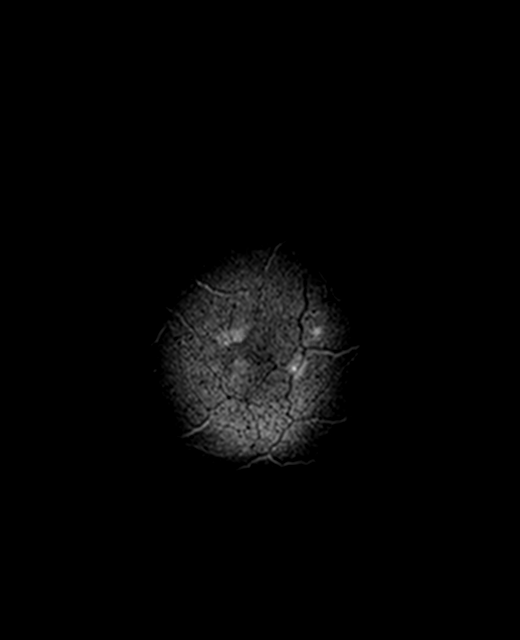

[Series 9: FLAIR · axial · 3.0mm · 0.72mm/px · z∈[-92,+58]mm · 2 of 26 slices shown (1 of 2)]
[im 1/26]
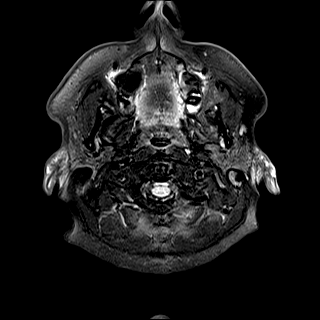
[im 26/26]
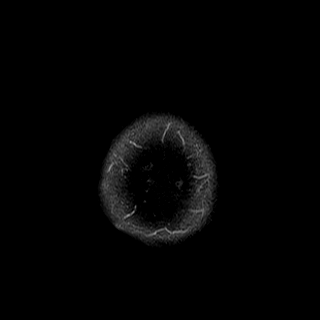

[Series 11: swi_images · axial · 1.5mm · 0.90mm/px · z∈[-87,+55]mm · 9 of 96 slices shown]
[im 1/96]
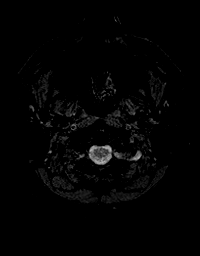
[im 12/96]
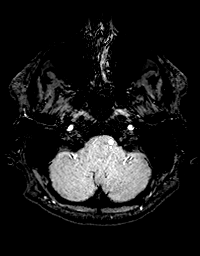
[im 24/96]
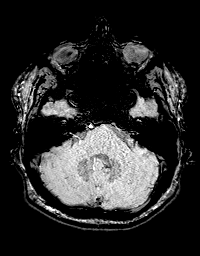
[im 36/96]
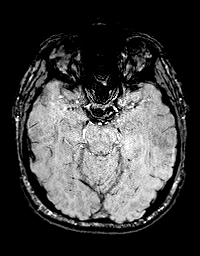
[im 48/96]
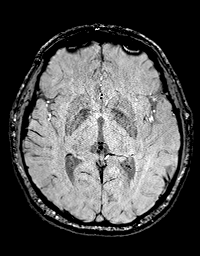
[im 60/96]
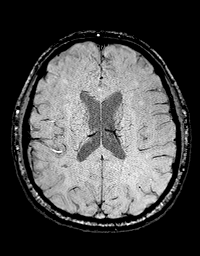
[im 72/96]
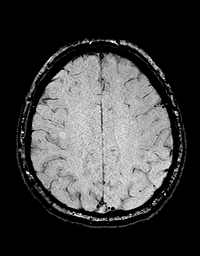
[im 84/96]
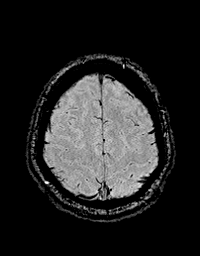
[im 96/96]
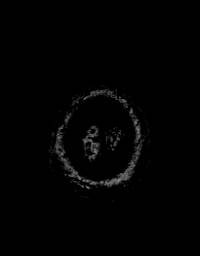

[Series 12: T1 · axial · 1.0mm · 0.90mm/px · z∈[-97,+62]mm · 14 of 160 slices shown (2 of 2)]
[im 1/160]
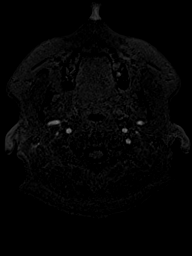
[im 13/160]
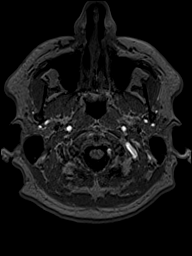
[im 25/160]
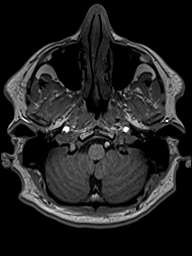
[im 37/160]
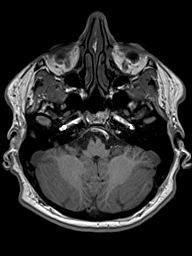
[im 49/160]
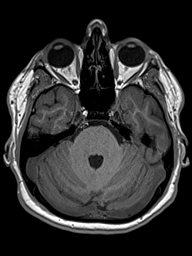
[im 62/160]
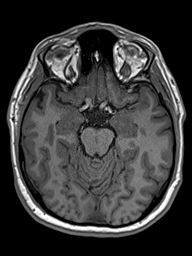
[im 74/160]
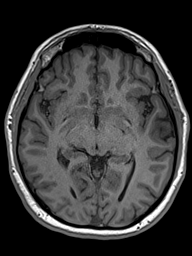
[im 86/160]
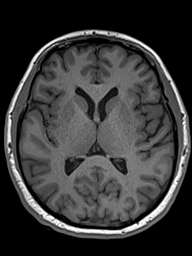
[im 98/160]
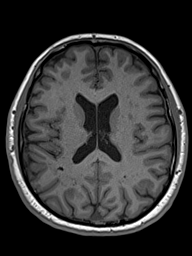
[im 111/160]
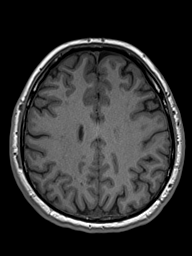
[im 123/160]
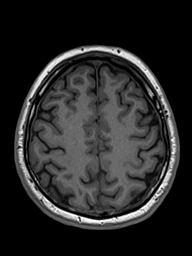
[im 135/160]
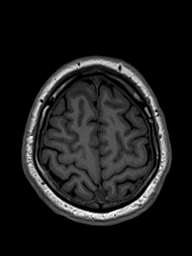
[im 147/160]
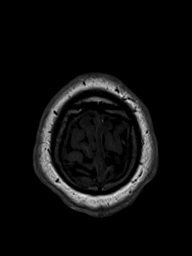
[im 160/160]
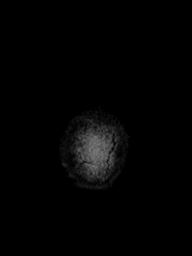

[Series 13: T2 · coronal · 4.5mm · 0.36mm/px · 3 of 30 slices shown (2 of 2)]
[im 1/30]
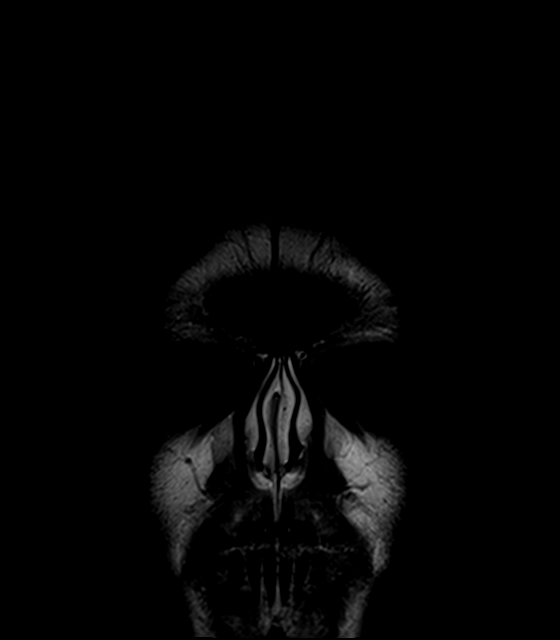
[im 15/30]
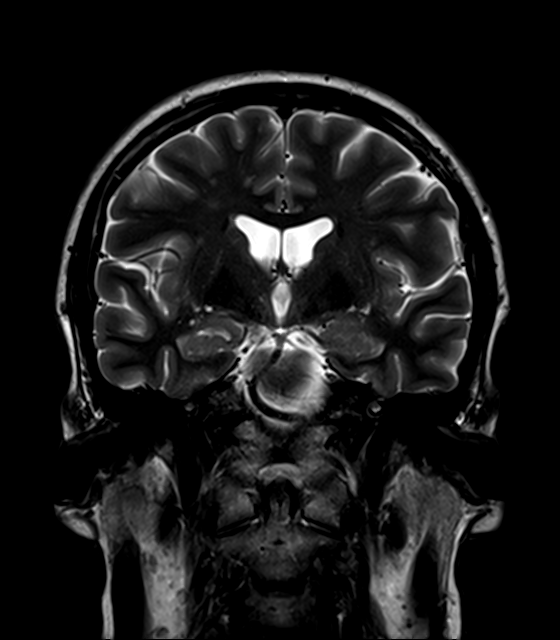
[im 30/30]
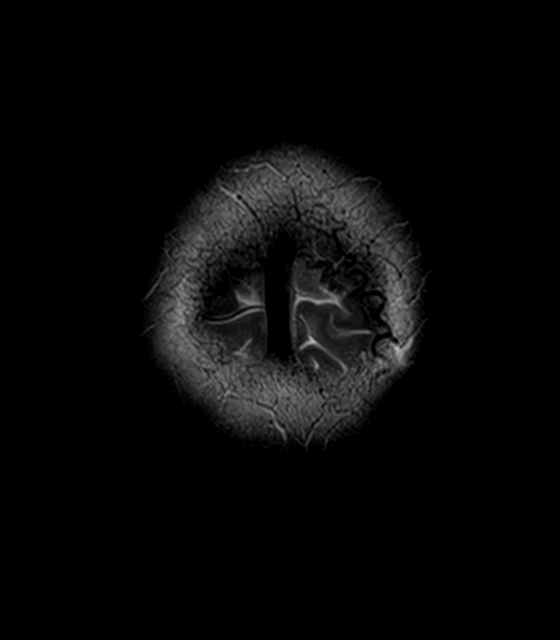

[Series 14: FLAIR · sagittal · 4.0mm · 0.72mm/px · 3 of 28 slices shown (2 of 2)]
[im 1/28]
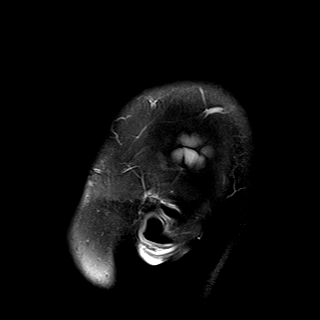
[im 14/28]
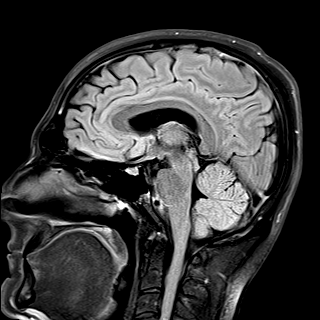
[im 28/28]
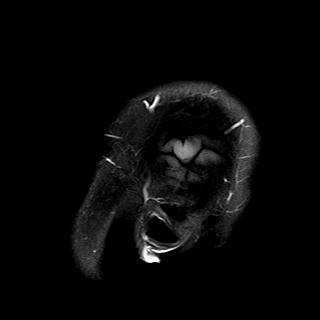

[48 of 48 positions shown; findings below may reference images not displayed]

FINDINGS: Brain: There is no evidence of acute infarct, intracranial
hemorrhage, mass, midline shift, or extra-axial fluid collection.
The ventricles and sulci are normal. There are numerous
subcentimeter foci of T2 hyperintensity in the subcortical and deep
cerebral white matter bilaterally. The periventricular white matter
is relatively spared. The corpus callosum, brainstem, and cerebellum
are normal in appearance.

Vascular: Major intracranial vascular flow voids are preserved.

Skull and upper cervical spine: Unremarkable bone marrow signal.

Sinuses/Orbits: Unremarkable orbits. Paranasal sinuses and mastoid
air cells are clear.

Other: None.
IMPRESSION: 1. No acute intracranial abnormality.
2. Numerous small foci of cerebral white matter T2 signal
abnormality, nonspecific. Considerations include complicated
migraines, premature chronic small vessel ischemia, sequelae of
trauma, hypercoagulable state, vasculitis, and prior infection. No
lesions specific for demyelinating disease.

## 2019-01-04 ENCOUNTER — Ambulatory Visit: Payer: BC Managed Care – PPO | Admitting: Allergy

## 2019-01-05 ENCOUNTER — Ambulatory Visit (INDEPENDENT_AMBULATORY_CARE_PROVIDER_SITE_OTHER): Payer: BC Managed Care – PPO | Admitting: Allergy

## 2019-01-05 ENCOUNTER — Other Ambulatory Visit: Payer: Self-pay | Admitting: Allergy

## 2019-01-05 ENCOUNTER — Other Ambulatory Visit: Payer: Self-pay

## 2019-01-05 ENCOUNTER — Encounter: Payer: Self-pay | Admitting: Allergy

## 2019-01-05 VITALS — BP 100/60 | HR 86 | Temp 97.2°F | Resp 16 | Ht 68.0 in | Wt 182.0 lb

## 2019-01-05 DIAGNOSIS — J4520 Mild intermittent asthma, uncomplicated: Secondary | ICD-10-CM

## 2019-01-05 DIAGNOSIS — H1013 Acute atopic conjunctivitis, bilateral: Secondary | ICD-10-CM | POA: Diagnosis not present

## 2019-01-05 DIAGNOSIS — J45909 Unspecified asthma, uncomplicated: Secondary | ICD-10-CM | POA: Insufficient documentation

## 2019-01-05 DIAGNOSIS — J3089 Other allergic rhinitis: Secondary | ICD-10-CM | POA: Diagnosis not present

## 2019-01-05 MED ORDER — LEVALBUTEROL TARTRATE 45 MCG/ACT IN AERO
2.0000 | INHALATION_SPRAY | Freq: Four times a day (QID) | RESPIRATORY_TRACT | 2 refills | Status: DC | PRN
Start: 2019-01-05 — End: 2019-01-05

## 2019-01-05 MED ORDER — PAZEO 0.7 % OP SOLN: 1.0000 [drp] | Freq: Every day | OPHTHALMIC | 5 refills | Status: DC | PRN

## 2019-01-05 NOTE — Patient Instructions (Addendum)
Today's skin testing showed: Positive to grass, weed, trees, mold, dust mites.   Start environmental control measures.  May use over the counter antihistamines such as Zyrtec (cetirizine), Claritin (loratadine), Allegra (fexofenadine), or Xyzal (levocetirizine) daily as needed.  Take daily antihistamine starting in the spring.   May use mometasone nasal spray 1-2 sprays per nostril once a day as needed for nasal congestion.  Take daily during the spring.   Nasal saline spray (i.e., Simply Saline) or nasal saline lavage (i.e., NeilMed) is recommended as needed and prior to medicated nasal sprays.  May use Pazeo 1 drop in each eye daily as needed for itchy/watery eyes.   Had a detailed discussion with patient/family that clinical history is suggestive of allergic rhinitis, and may benefit from allergy immunotherapy (AIT). Discussed in detail regarding the dosing, schedule, side effects (mild to moderate local allergic reaction and rarely systemic allergic reactions including anaphylaxis), and benefits (significant improvement in nasal symptoms, seasonal flares of asthma) of immunotherapy with the patient. There is significant time commitment involved with allergy shots, which includes weekly immunotherapy injections for first 9-12 months and then biweekly to monthly injections for 3-5 years.   Read about allergy injections.   Reactive airway disease:  May use levoalbuterol rescue inhaler 2 puffs as needed for shortness of breath, chest tightness, coughing, and wheezing. May use levoalbuterol rescue inhaler 2 puffs 5 to 15 minutes prior to strenuous physical activities. Monitor frequency of use.   If you are using it more than 2 times a week let us know.   Follow up in 4 months or sooner if needed.  Reducing Pollen Exposure . Pollen seasons: trees (spring), grass (summer) and ragweed/weeds (fall). Marland Kitchen Keep windows closed in your home and car to lower pollen exposure.  Susa Simmonds air  conditioning in the bedroom and throughout the house if possible.  . Avoid going out in dry windy days - especially early morning. . Pollen counts are highest between 5 - 10 AM and on dry, hot and windy days.  . Save outside activities for late afternoon or after a heavy rain, when pollen levels are lower.  . Avoid mowing of grass if you have grass pollen allergy. Marland Kitchen Be aware that pollen can also be transported indoors on people and pets.  . Dry your clothes in an automatic dryer rather than hanging them outside where they might collect pollen.  . Rinse hair and eyes before bedtime.  Mold Control . Mold and fungi can grow on a variety of surfaces provided certain temperature and moisture conditions exist.  . Outdoor molds grow on plants, decaying vegetation and soil. The major outdoor mold, Alternaria and Cladosporium, are found in very high numbers during hot and dry conditions. Generally, a late summer - fall peak is seen for common outdoor fungal spores. Rain will temporarily lower outdoor mold spore count, but counts rise rapidly when the rainy period ends. . The most important indoor molds are Aspergillus and Penicillium. Dark, humid and poorly ventilated basements are ideal sites for mold growth. The next most common sites of mold growth are the bathroom and the kitchen. Outdoor (Seasonal) Mold Control . Use air conditioning and keep windows closed. . Avoid exposure to decaying vegetation. Marland Kitchen Avoid leaf raking. . Avoid grain handling. . Consider wearing a face mask if working in moldy areas.  Indoor (Perennial) Mold Control  . Maintain humidity below 50%. . Get rid of mold growth on hard surfaces with water, detergent and, if necessary, 5%  bleach (do not mix with other cleaners). Then dry the area completely. If mold covers an area more than 10 square feet, consider hiring an indoor environmental professional. . For clothing, washing with soap and water is best. If moldy items cannot be  cleaned and dried, throw them away. . Remove sources e.g. contaminated carpets. . Repair and seal leaking roofs or pipes. Using dehumidifiers in damp basements may be helpful, but empty the water and clean units regularly to prevent mildew from forming. All rooms, especially basements, bathrooms and kitchens, require ventilation and cleaning to deter mold and mildew growth. Avoid carpeting on concrete or damp floors, and storing items in damp areas. Control of House Dust Mite Allergen . Dust mite allergens are a common trigger of allergy and asthma symptoms. While they can be found throughout the house, these microscopic creatures thrive in warm, humid environments such as bedding, upholstered furniture and carpeting. . Because so much time is spent in the bedroom, it is essential to reduce mite levels there.  . Encase pillows, mattresses, and box springs in special allergen-proof fabric covers or airtight, zippered plastic covers.  . Bedding should be washed weekly in hot water (130 F) and dried in a hot dryer. Allergen-proof covers are available for comforters and pillows that can't be regularly washed.  Reyes Ivan the allergy-proof covers every few months. Minimize clutter in the bedroom. Keep pets out of the bedroom.  Marland Kitchen Keep humidity less than 50% by using a dehumidifier or air conditioning. You can buy a humidity measuring device called a hygrometer to monitor this.  . If possible, replace carpets with hardwood, linoleum, or washable area rugs. If that's not possible, vacuum frequently with a vacuum that has a HEPA filter. . Remove all upholstered furniture and non-washable window drapes from the bedroom. . Remove all non-washable stuffed toys from the bedroom.  Wash stuffed toys weekly.

## 2019-01-05 NOTE — Assessment & Plan Note (Signed)
   See assessment and plan as above for allergic rhinitis.  

## 2019-01-05 NOTE — Assessment & Plan Note (Signed)
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 Using OTC antihistamines and mometasone nasal spray with some benefit. Skin testing 7 years ago was positive to trees, weed, grass, dogs, cats per patient report. No previous allergy immunotherapy.   Today's skin testing showed: Positive to grass, weed, trees, mold, dust mites.  Start environmental control measures.  May use over the counter antihistamines such as Zyrtec (cetirizine), Claritin (loratadine), Allegra (fexofenadine), or Xyzal (levocetirizine) daily as needed.  Take daily antihistamine starting in the spring.   May use mometasone nasal spray 1-2 sprays per nostril once a day as needed for nasal congestion.  Take daily during the spring.   Nasal saline spray (i.e., Simply Saline) or nasal saline lavage (i.e., NeilMed) is recommended as needed and prior to medicated nasal sprays.  May use Pazeo 1 drop in each eye daily as needed for itchy/watery eyes.   Had a detailed discussion with patient/family that clinical history is suggestive of allergic rhinitis, and may benefit from allergy immunotherapy (AIT). Discussed in detail regarding the dosing, schedule, side effects (mild to moderate local allergic reaction and rarely systemic allergic reactions including anaphylaxis), and benefits (significant improvement in nasal symptoms, seasonal flares of asthma) of immunotherapy with the patient. There is significant time commitment involved with allergy shots, which includes weekly immunotherapy injections for first 9-12 months and then biweekly to monthly injections for 3-5 years.   Patient prefers medical therapy over immunotherapy at this time.

## 2019-01-05 NOTE — Progress Notes (Signed)
New Patient Note  RE: Dave Luna MRN: [id number] DOB: 23-Apr-1980 Date of Office Visit: 01/05/2019  Referring provider: [place], Betty G, MD Primary care provider: [place], Betty G, MD  Chief Complaint: Allergic Rhinitis   History of Present Illness: I had the pleasure of seeing Dave Luna for initial evaluation at the [facility] on 01/05/2019. He is a 38 y.o. male, who is self-referred here for the evaluation of allergic rhinitis.  Rhinitis: He reports symptoms of sneezing, nasal congestion, rhinorrhea since a childhood but the last few years noticing some worsening symptoms with shortness of breath and itch eyes in the fall now. The symptoms are present from spring through fall. Other triggers include exposure to outdoors. Anosmia: no. Headache: yes. He has used Nasacort, Claritin and OTC antihistamines with some improvement in symptoms. Sinus infections: one this year. Previous work up includes: skin testing 7 years ago was positive to trees, weed, grass, dogs, cats per patient report. No previous allergy immunotherapy.  Previous ENT evaluation: no. Previous sinus imaging: no. History of nasal polyps: no. Last eye exam: about 1 year ago. History of reflux: no.  Respiratory: He reports symptoms of shortness of breath, wheezing for 1 year. Current medications include none. He reports not using aerochamber with inhalers. He tried the following inhalers: albuterol as a child but caused palpitations. Main triggers are outdoors. In the last month, frequency of symptoms: 0x/week. Frequency of nocturnal symptoms: 0x/month. Frequency of SABA use: 0x/week. Interference with physical activity: 0. Sleep is undisturbed. In the last 12 months, emergency room visits/urgent care visits/doctor office visits or hospitalizations due to respiratory issues: 0. In the last 12 months, oral steroids courses: 0. Lifetime history of hospitalization for respiratory issues: no. Prior intubations:  no. History of pneumonia: no. He was evaluated by allergist in the past. Smoking exposure: social [place]. Up to date with flu vaccine: yes.   Assessment and Plan: Dave Luna is a 38 y.o. male with: Reactive airway disease Shortness of breath and wheezing for the past 1 year mainly after spending significant time outdoors. Symptoms resolve the next day if takes additional antihistamine. No previous diagnosis of asthma but had albuterol as a child but stopped due to palpitations.   Today's skin testing showed: Positive to grass, weed, trees, mold, dust mites.  Today's spirometry showed: No overt abnormalities noted given today's efforts with no significant improvement in FEV1 post bronchodilator treatment.   May use levoalbuterol rescue inhaler 2 puffs as needed for shortness of breath, chest tightness, coughing, and wheezing. May use levoalbuterol rescue inhaler 2 puffs 5 to 15 minutes prior to strenuous physical activities. Monitor frequency of use.   Spacer given and demonstrated proper use with inhaler. Patient understood technique and all questions/concerned were addressed.   If using it more than 2 times a week let us know and may need daily ICS controller inhaler during certain seasons.   Other allergic rhinitis Worsening rhino conjunctivitis symptoms from spring through fall for the last few years. Using OTC antihistamines and mometasone nasal spray with some benefit. Skin testing 7 years ago was positive to trees, weed, grass, dogs, cats per patient report. No previous allergy immunotherapy.   Today's skin testing showed: Positive to grass, weed, trees, mold, dust mites.  Start environmental control measures.  May use over the counter antihistamines such as Zyrtec (cetirizine), Claritin (loratadine), Allegra (fexofenadine), or Xyzal (levocetirizine) daily as needed.  Take daily antihistamine starting in the spring.   May use mometasone nasal  spray 1-2 sprays per nostril once a day as  needed for nasal congestion.  Take daily during the spring.   Nasal saline spray (i.e., Simply Saline) or nasal saline lavage (i.e., NeilMed) is recommended as needed and prior to medicated nasal sprays.  May use Pazeo 1 drop in each eye daily as needed for itchy/watery eyes.   Had a detailed discussion with patient/family that clinical history is suggestive of allergic rhinitis, and may benefit from allergy immunotherapy (AIT). Discussed in detail regarding the dosing, schedule, side effects (mild to moderate local allergic reaction and rarely systemic allergic reactions including anaphylaxis), and benefits (significant improvement in nasal symptoms, seasonal flares of asthma) of immunotherapy with the patient. There is significant time commitment involved with allergy shots, which includes weekly immunotherapy injections for first 9-12 months and then biweekly to monthly injections for 3-5 years.   Patient prefers medical therapy over immunotherapy at this time.   Allergic conjunctivitis of both eyes  See assessment and plan as above for allergic rhinitis.   Return in about 4 months (around 05/06/2019).  Meds ordered this encounter  Medications   levalbuterol (XOPENEX HFA) 45 MCG/ACT inhaler    Sig: Inhale 2 puffs into the lungs every 6 (six) hours as needed for wheezing or shortness of breath.    Dispense:  1 Inhaler    Refill:  2   Olopatadine HCl (PAZEO) 0.7 % SOLN    Sig: Apply 1 drop to eye daily as needed.    Dispense:  2.5 mL    Refill:  5   Other allergy screening: Food allergy: no Medication allergy: yes  cipro - palpitations Hymenoptera allergy: no Urticaria: no Eczema:no History of recurrent infections suggestive of immunodeficency: no  Diagnostics: Spirometry:  Tracings reviewed. His effort: It was hard to get consistent efforts and there is a question as to whether this reflects a maximal maneuver. FVC: 5.02L FEV1: 4.76L, 117% predicted FEV1/FVC ratio:  95% Interpretation: No overt abnormalities noted given today's efforts with no significant improvement in FEV1 post bronchodilator treatment.  Please see scanned spirometry results for details.  Skin Testing: Environmental allergy panel. Positive test to: grass, weed, trees, mold, dust mites. Results discussed with patient/family. Airborne Adult Perc - 01/05/19 0905    Time Antigen Placed  [id number]    Allergen Manufacturer  Waynette ButteryGreer    Location  Back    Number of Test  59    Panel 1  Select    1. Control-Buffer 50% Glycerol  Negative    2. Control-Histamine 1 mg/ml  2+    3. Albumin saline  Negative    4. Bahia  3+    5. [place]  2+    6. Johnson  3+    7. Kentucky Blue  3+    8. Meadow Fescue  2+    9. Perennial Rye  3+    10. Sweet Vernal  2+    11. Timothy  3+    12. Cocklebur  Negative    13. Burweed Marshelder  Negative    14. Ragweed, short  Negative    15. Ragweed, Giant  Negative    16. Plantain,  English  --   +/-   17. Lamb's Quarters  Negative    18. Sheep Sorrell  Negative    19. Rough Pigweed  Negative    20. Marsh Elder, Rough  Negative    21. Mugwort, Common  Negative    22. Ash mix  Negative  23Charletta Cousin mix  Negative    24. Beech American  Negative    25. Box, Elder  Negative    26. Cedar, red  3+    27. Cottonwood, Guinea-Bissau  Negative    28. Elm mix  Negative    29. Hickory mix  Negative    30. Maple mix  Negative    31. Oak, Guinea-Bissau mix  Negative    32. Pecan Pollen  Negative    33. Pine mix  Negative    34. Sycamore Eastern  Negative    35. Walnut, Black Pollen  Negative    36. Alternaria alternata  Negative    37. Cladosporium Herbarum  Negative    38. Aspergillus mix  Negative    39. Penicillium mix  Negative    40. Bipolaris sorokiniana (Helminthosporium)  Negative    41. Drechslera spicifera (Curvularia)  Negative    42. Mucor plumbeus  Negative    43. Fusarium moniliforme  Negative    44. Aureobasidium pullulans (pullulara)  Negative    45.  Rhizopus oryzae  Negative    46. Botrytis cinera  Negative    47. Epicoccum nigrum  Negative    48. Phoma betae  Negative    49. Candida Albicans  Negative    50. Trichophyton mentagrophytes  Negative    51. Mite, D Farinae  5,000 AU/ml  Negative    52. Mite, D Pteronyssinus  5,000 AU/ml  Negative    53. Cat Hair 10,000 BAU/ml  Negative    54.  Dog Epithelia  Negative    55. Mixed Feathers  Negative    56. Horse Epithelia  Negative    57. Cockroach, German  Negative    58. Mouse  Negative    59. Tobacco Leaf  Negative     Intradermal - 01/05/19 0934    Time Antigen Placed  2130    Allergen Manufacturer  Waynette Buttery    Location  Arm    Number of Test  12    Intradermal  Select    Control  Negative    Ragweed mix  Negative    Weed mix  3+    Tree mix  3+    Mold 1  2+    Mold 2  Negative    Mold 3  Negative    Mold 4  Negative    Cat  Negative    Dog  Negative    Cockroach  Negative    Mite mix  1+       Past Medical History: Patient Active Problem List   Diagnosis Date Noted   Reactive airway disease 01/05/2019   Allergic conjunctivitis of both eyes 01/05/2019   Other allergic rhinitis 03/23/2018   Cardiac arrhythmia, unspecified 06/30/2016   BMI 33.0-33.9,adult 02/06/2016   Past Medical History:  Diagnosis Date   Palpitations    Past Surgical History: Past Surgical History:  Procedure Laterality Date   ANTERIOR CRUCIATE LIGAMENT REPAIR Left    KNEE ARTHROSCOPY Left 10/2016   None     WISDOM TOOTH EXTRACTION  08/2018   Medication List:  Current Outpatient Medications  Medication Sig Dispense Refill   acetaminophen (TYLENOL) 500 MG tablet Take 500 mg by mouth as needed for mild pain.     fexofenadine (ALLEGRA) 180 MG tablet Take 180 mg by mouth daily as needed for allergies or rhinitis.     ibuprofen (ADVIL,MOTRIN) 200 MG tablet Take 200 mg by mouth every 6 (six) hours  as needed for moderate pain.     mometasone (NASONEX) 50 MCG/ACT nasal spray  Place 2 sprays into the nose daily. 17 g 6   levalbuterol (XOPENEX HFA) 45 MCG/ACT inhaler Inhale 2 puffs into the lungs every 6 (six) hours as needed for wheezing or shortness of breath. 1 Inhaler 2   Olopatadine HCl (PAZEO) 0.7 % SOLN Apply 1 drop to eye daily as needed. 2.5 mL 5   No current facility-administered medications for this visit.   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 He drinks one cup of coffee and tea a day. He exercises 3-4 x a week. He has a 2nd floor apartment.   Social Determinants of Health   Financial Resource Strain:    Difficulty of Paying Living Expenses: Not on file  Food Insecurity:    Worried About Programme researcher, broadcasting/film/video in the Last Year: Not on file   [employer] of Food in the Last Year: Not on file  Transportation Needs:    Lack of Transportation (Medical): Not on file   Lack of Transportation (Non-Medical): Not on file  Physical Activity:    Days of Exercise per Week: Not on file   Minutes of Exercise per Session: Not on file  Stress:    Feeling of Stress : Not on file  Social Connections:    Frequency of Communication with Friends and Family: Not on file   Frequency of Social Gatherings with Friends and Family: Not on file   Attends Religious Services: Not on file   Active Member of  Clubs or Organizations: Not on file   Attends Banker Meetings: Not on file   Marital Status: Not on file   Lives in a 29+ year old apartment. Smoking: denies Occupation: Engineer, structural, works in an office.   Environmental History: Water Damage/mildew in the house: no Carpet in the family room: yes Carpet in the bedroom: yes Heating: electric Cooling: central Pet: no  Family History: Family History  Problem Relation Age of Onset   Breast cancer Mother    Atrial fibrillation Father    Heart disease Maternal Uncle    Hypertension Maternal Uncle    Diabetes Paternal Grandfather    Diabetes Maternal Uncle    Diabetes Paternal Uncle    Problem                               Relation Asthma                                   Maternal aunt Eczema  No  Food allergy                          No  Allergic rhino conjunctivitis     Maternal aunt  Review of Systems  Constitutional: Negative for appetite change, chills, fever and unexpected weight change.  HENT: Negative for congestion and rhinorrhea.   Eyes: Negative for itching.  Respiratory: Negative for cough, chest tightness, shortness of breath and wheezing.   Cardiovascular: Negative for chest pain.  Gastrointestinal: Negative for abdominal pain.  Genitourinary: Negative for difficulty urinating.  Skin: Negative for rash.  Allergic/Immunologic: Positive for environmental allergies. Negative for food allergies.  Neurological: Negative for headaches.   Objective: BP 100/60    Pulse 86    Temp (!) 97.2 F (36.2 C) (Temporal)    Resp 16    Ht 5\' 8"  (1.727 m)    Wt 182 lb (82.6 kg)    SpO2 98%    BMI 27.67 kg/m  Body mass index is 27.67 kg/m. Physical Exam  Constitutional: He is oriented to person, place, and time. He appears well-developed and well-nourished.  HENT:  Head: Normocephalic and atraumatic.  Right Ear: External ear normal.  Left Ear: External ear normal.  Nose:  Nose normal.  Mouth/Throat: Oropharynx is clear and moist.  Eyes: Conjunctivae and EOM are normal.  Cardiovascular: Normal rate, regular rhythm and normal heart sounds. Exam reveals no gallop and no friction rub.  No murmur heard. Pulmonary/Chest: Effort normal and breath sounds normal. He has no wheezes. He has no rales.  Abdominal: Soft.  Musculoskeletal:     Cervical back: Neck supple.  Neurological: He is alert and oriented to person, place, and time.  Skin: Skin is warm. No rash noted.  Psychiatric: He has a normal mood and affect. His behavior is normal.  Nursing note and vitals reviewed.  The plan was reviewed with the patient/family, and all questions/concerned were addressed.  It was my pleasure to see Dave Luna today and participate in his care. Please feel free to contact me with any questions or concerns.  Sincerely,  Rexene Alberts, DO Allergy & Immunology  [facility] office: [phone] [facility] office: [place] office: [phone]

## 2019-01-05 NOTE — Assessment & Plan Note (Signed)
Shortness of breath and wheezing for the past 1 year mainly after spending significant time outdoors. Symptoms resolve the next day if takes additional antihistamine. No previous diagnosis of asthma but had albuterol as a child but stopped due to palpitations.   Today's skin testing showed: Positive to grass, weed, trees, mold, dust mites.  Today's spirometry showed: No overt abnormalities noted given today's efforts with no significant improvement in FEV1 post bronchodilator treatment.   May use levoalbuterol rescue inhaler 2 puffs as needed for shortness of breath, chest tightness, coughing, and wheezing. May use levoalbuterol rescue inhaler 2 puffs 5 to 15 minutes prior to strenuous physical activities. Monitor frequency of use.   Spacer given and demonstrated proper use with inhaler. Patient understood technique and all questions/concerned were addressed.   If using it more than 2 times a week let us know and may need daily ICS controller inhaler during certain seasons.

## 2019-01-10 ENCOUNTER — Telehealth: Payer: Self-pay | Admitting: *Deleted

## 2019-01-10 NOTE — Telephone Encounter (Signed)
PA has been approved for Levalbuterol HFA. PA form has been faxed to pharmacy, labeled, and placed in bulk scanning.

## 2019-01-10 NOTE — Telephone Encounter (Signed)
PA has been submitted for Levalbuterol HFA through CoverMyMeds and is currently waiting approval or denial.

## 2019-02-06 ENCOUNTER — Other Ambulatory Visit: Payer: Self-pay | Admitting: *Deleted

## 2019-02-06 MED ORDER — PAZEO 0.7 % OP SOLN: 1.0000 [drp] | Freq: Every day | OPHTHALMIC | 5 refills | Status: DC | PRN

## 2019-02-07 ENCOUNTER — Telehealth: Payer: Self-pay | Admitting: *Deleted

## 2019-02-07 ENCOUNTER — Other Ambulatory Visit: Payer: Self-pay | Admitting: *Deleted

## 2019-02-07 NOTE — Telephone Encounter (Signed)
Pharmacy sent a fax stating that Dave Luna has been discontinued by the manufacturer. Please advise a different medication to replace Pazeo. Thank You.

## 2019-02-08 MED ORDER — OLOPATADINE HCL 0.2 % OP SOLN: 1.0000 [drp] | Freq: Every day | OPHTHALMIC | 5 refills | Status: AC | PRN

## 2019-02-08 NOTE — Addendum Note (Signed)
Addended by: Ellamae Sia on: 02/08/2019 08:31 AM   Modules accepted: Orders

## 2019-02-08 NOTE — Telephone Encounter (Signed)
Sent in olopatadine0.2% 1 drop daily prn itchy/watery eyes.  If that is not covered, please look through formulary and see what is covered by their insurance.  Thank you.

## 2019-04-29 ENCOUNTER — Ambulatory Visit: Payer: Self-pay | Attending: Internal Medicine

## 2019-04-29 DIAGNOSIS — Z23 Encounter for immunization: Secondary | ICD-10-CM

## 2019-04-29 NOTE — Progress Notes (Signed)
   Covid-19 Vaccination Clinic  Name:  [name]    MRN: [id number] DOB: Aug 17, 1980  04/29/2019  Dave Luna was observed post Covid-19 immunization for 15 minutes without incident. He was provided with Vaccine Information Sheet and instruction to access the V-Safe system.   [name] was instructed to call 911 with any severe reactions post vaccine: Marland Kitchen Difficulty breathing  . Swelling of face and throat  . A fast heartbeat  . A bad rash all over body  . Dizziness and weakness   Immunizations Administered    Name Date Dose VIS Date Route   Pfizer COVID-19 Vaccine 04/29/2019  8:38 AM 0.3 mL 01/06/2019 Intramuscular   Manufacturer: [employer], [employer]   Lot: [id number]   NDC: [address]-3

## 2019-05-11 ENCOUNTER — Ambulatory Visit (INDEPENDENT_AMBULATORY_CARE_PROVIDER_SITE_OTHER): Payer: BC Managed Care – PPO | Admitting: Allergy

## 2019-05-11 ENCOUNTER — Ambulatory Visit: Payer: BC Managed Care – PPO | Admitting: Allergy

## 2019-05-11 ENCOUNTER — Encounter: Payer: Self-pay | Admitting: Allergy

## 2019-05-11 ENCOUNTER — Other Ambulatory Visit: Payer: Self-pay

## 2019-05-11 VITALS — BP 110/80 | HR 74 | Temp 97.8°F | Ht 66.5 in | Wt 184.0 lb

## 2019-05-11 DIAGNOSIS — J4520 Mild intermittent asthma, uncomplicated: Secondary | ICD-10-CM

## 2019-05-11 DIAGNOSIS — J3089 Other allergic rhinitis: Secondary | ICD-10-CM | POA: Diagnosis not present

## 2019-05-11 DIAGNOSIS — J302 Other seasonal allergic rhinitis: Secondary | ICD-10-CM | POA: Diagnosis not present

## 2019-05-11 DIAGNOSIS — H1010 Acute atopic conjunctivitis, unspecified eye: Secondary | ICD-10-CM

## 2019-05-11 MED ORDER — MONTELUKAST SODIUM 10 MG PO TABS: 10.0000 mg | ORAL_TABLET | Freq: Every day | ORAL | 5 refills | Status: DC

## 2019-05-11 NOTE — Progress Notes (Signed)
Follow Up Note  RE: Dave Luna MRN: [id number] DOB: 1980/04/01 Date of Office Visit: 05/11/2019  Referring provider: [place], Betty G, MD Primary care provider: [place], Betty G, MD  Chief Complaint: Allergic Rhinitis  (up and down as usual only used inhaler twice since last time in)  History of Present Illness: I had the pleasure of seeing Dave Luna for a follow up visit at the [facility] on 05/11/2019. He is a 39 y.o. male, who is being followed for reactive airway disease and allergic rhinoconjunctivitis. His previous allergy office visit was on 01/05/2019 with Dr. Selena Luna. Today is a regular follow up visit.  Reactive airway disease Used albuterol twice the past 2 months after being outdoors for heavy breathing with good benefit.   Allergic rhino conjunctivitis Having some sneezing, rhinorrhea, watery eyes. Currently on pazeo eye drops as needed with good benefit. Allegra daily in the morning and mometasone 1 spray once a day. No nosebleeds.   Assessment and Plan: Dave Luna is a 39 y.o. male with: Reactive airway disease Past history - Shortness of breath and wheezing for the past 1 year mainly after spending significant time outdoors. Symptoms resolve the next day if takes additional antihistamine. No previous diagnosis of asthma but had albuterol as a child but stopped due to palpitations. 2020 spirometry showed: No overt abnormalities noted with no significant improvement in FEV1 post bronchodilator treatment.  Interim history - used albuterol twice the past 2 months after being outdoors with good benefit.   May use levoalbuterol rescue inhaler 2 puffs as needed for shortness of breath, chest tightness, coughing, and wheezing. May use levoalbuterol rescue inhaler 2 puffs 5 to 15 minutes prior to strenuous physical activities. Monitor frequency of use.   If using it more than 2 times a week let us know and may need daily ICS controller inhaler during certain seasons.    Seasonal and perennial allergic rhinoconjunctivitis Past history - Worsening rhino conjunctivitis symptoms from spring through fall for the last few years. Skin testing 7 years ago was positive to trees, weed, grass, dogs, cats per patient report. No previous allergy immunotherapy. 2020 skin testing showed: Positive to grass, weed, trees, mold, dust mites. Interim history - increased rhino conjunctivitis symptoms with the tree pollen. Prefers medical therapy over immunotherapy at this time.   Continue environmental control measures.  May use over the counter antihistamines such as Zyrtec (cetirizine), Claritin (loratadine), Allegra (fexofenadine), or Xyzal (levocetirizine) daily as needed. ? Take daily antihistamine starting in the spring and may take it twice a day if needed.  May use mometasone nasal spray 1 spray per nostril 1-2 times a day as needed for nasal congestion. ? Take daily during the spring.   Nasal saline spray (i.e., Simply Saline) or nasal saline lavage (i.e., NeilMed) is recommended as needed and prior to medicated nasal sprays.  May use Pazeo 1 drop in each eye daily as needed for itchy/watery eyes.  Start Singulair (montelukast) 10mg  daily at night. Cautioned that in some children/adults can experience behavioral changes including hyperactivity, agitation, depression, sleep disturbances and suicidal ideations. These side effects are rare, but if you notice them you should notify me and discontinue Singulair (montelukast).  Return in about 4 months (around 09/10/2019).  Meds ordered this encounter  Medications  . montelukast (SINGULAIR) 10 MG tablet    Sig: Take 1 tablet (10 mg total) by mouth at bedtime.    Dispense:  30 tablet    Refill:  5  Diagnostics: None  Medication List:  Current Outpatient Medications  Medication Sig Dispense Refill  . acetaminophen (TYLENOL) 500 MG tablet Take 500 mg by mouth as needed for mild pain.    . fexofenadine (ALLEGRA) 180  MG tablet Take 180 mg by mouth daily as needed for allergies or rhinitis.    Marland Kitchen ibuprofen (ADVIL,MOTRIN) 200 MG tablet Take 200 mg by mouth every 6 (six) hours as needed for moderate pain.    Marland Kitchen levalbuterol (XOPENEX HFA) 45 MCG/ACT inhaler TAKE 2 PUFFS BY MOUTH EVERY 6 HOURS AS NEEDED FOR WHEEZE OR SHORTNESS OF BREATH 15 g 2  . mometasone (NASONEX) 50 MCG/ACT nasal spray Place 2 sprays into the nose daily. 17 g 6  . Olopatadine HCl 0.2 % SOLN Apply 1 drop to eye daily as needed (itchy/watery eyes). 2.5 mL 5  . montelukast (SINGULAIR) 10 MG tablet Take 1 tablet (10 mg total) by mouth at bedtime. 30 tablet 5   No current facility-administered medications for this visit.   Allergies: Allergies  Allergen Reactions  . Ciprofloxacin Palpitations   I reviewed his past medical history, social history, family history, and environmental history and no significant changes have been reported from his previous visit.  Review of Systems  Constitutional: Negative for appetite change, chills, fever and unexpected weight change.  HENT: Positive for rhinorrhea and sneezing. Negative for congestion.   Eyes: Positive for itching.  Respiratory: Negative for cough, chest tightness, shortness of breath and wheezing.   Cardiovascular: Negative for chest pain.  Gastrointestinal: Negative for abdominal pain.  Genitourinary: Negative for difficulty urinating.  Skin: Negative for rash.  Allergic/Immunologic: Positive for environmental allergies. Negative for food allergies.  Neurological: Negative for headaches.   Objective: BP 110/80 (BP Location: Left Arm, Patient Position: Sitting, Cuff Size: Normal)   Pulse 74   Temp 97.8 F (36.6 C) (Temporal)   Ht 5' 6.5" (1.689 m)   Wt 184 lb (83.5 kg)   SpO2 97%   BMI 29.25 kg/m  Body mass index is 29.25 kg/m. Physical Exam  Constitutional: He is oriented to person, place, and time. He appears well-developed and well-nourished.  HENT:  Head: Normocephalic and  atraumatic.  Right Ear: External ear normal.  Left Ear: External ear normal.  Nose: Nose normal.  Mouth/Throat: Oropharynx is clear and moist.  Eyes: Conjunctivae and EOM are normal.  Cardiovascular: Normal rate, regular rhythm and normal heart sounds. Exam reveals no gallop and no friction rub.  No murmur heard. Pulmonary/Chest: Effort normal and breath sounds normal. He has no wheezes. He has no rales.  Abdominal: Soft.  Musculoskeletal:     Cervical back: Neck supple.  Neurological: He is alert and oriented to person, place, and time.  Skin: Skin is warm. No rash noted.  Psychiatric: He has a normal mood and affect. His behavior is normal.  Nursing note and vitals reviewed.  Previous notes and tests were reviewed. The plan was reviewed with the patient/family, and all questions/concerned were addressed.  It was my pleasure to see Graden today and participate in his care. Please feel free to contact me with any questions or concerns.  Sincerely,  Rexene Alberts, DO Allergy & Immunology  [facility] office: [phone] [facility] office: [place] office: [phone]

## 2019-05-11 NOTE — Assessment & Plan Note (Signed)
Past history - Shortness of breath and wheezing for the past 1 year mainly after spending significant time outdoors. Symptoms resolve the next day if takes additional antihistamine. No previous diagnosis of asthma but had albuterol as a child but stopped due to palpitations. 2020 spirometry showed: No overt abnormalities noted with no significant improvement in FEV1 post bronchodilator treatment.  Interim history - used albuterol twice the past 2 months after being outdoors with good benefit.   May use levoalbuterol rescue inhaler 2 puffs as needed for shortness of breath, chest tightness, coughing, and wheezing. May use levoalbuterol rescue inhaler 2 puffs 5 to 15 minutes prior to strenuous physical activities. Monitor frequency of use.   If using it more than 2 times a week let us know and may need daily ICS controller inhaler during certain seasons.

## 2019-05-11 NOTE — Assessment & Plan Note (Signed)
Past history - Worsening rhino conjunctivitis symptoms from spring through fall for the last few years. Skin testing 7 years ago was positive to trees, weed, grass, dogs, cats per patient report. No previous allergy immunotherapy. 2020 skin testing showed: Positive to grass, weed, trees, mold, dust mites. Interim history - increased rhino conjunctivitis symptoms with the tree pollen. Prefers medical therapy over immunotherapy at this time.   Continue environmental control measures.  May use over the counter antihistamines such as Zyrtec (cetirizine), Claritin (loratadine), Allegra (fexofenadine), or Xyzal (levocetirizine) daily as needed. ? Take daily antihistamine starting in the spring and may take it twice a day if needed.  May use mometasone nasal spray 1 spray per nostril 1-2 times a day as needed for nasal congestion. ? Take daily during the spring.   Nasal saline spray (i.e., Simply Saline) or nasal saline lavage (i.e., NeilMed) is recommended as needed and prior to medicated nasal sprays.  May use Pazeo 1 drop in each eye daily as needed for itchy/watery eyes.  Start Singulair (montelukast) 10mg  daily at night. Cautioned that in some children/adults can experience behavioral changes including hyperactivity, agitation, depression, sleep disturbances and suicidal ideations. These side effects are rare, but if you notice them you should notify me and discontinue Singulair (montelukast).

## 2019-05-11 NOTE — Patient Instructions (Addendum)
Reactive airway disease  May use levoalbuterol rescue inhaler 2 puffs as needed for shortness of breath, chest tightness, coughing, and wheezing. May use levoalbuterol rescue inhaler 2 puffs 5 to 15 minutes prior to strenuous physical activities. Monitor frequency of use.   If using it more than 2 times a week let us know and may need daily ICS controller inhaler during certain seasons.   Other allergic rhinitis  Past skin testing showed: Positive to grass, weed, trees, mold, dust mites.  Continue environmental control measures.  May use over the counter antihistamines such as Zyrtec (cetirizine), Claritin (loratadine), Allegra (fexofenadine), or Xyzal (levocetirizine) daily as needed. ? Take daily antihistamine starting in the spring and may take it twice a day if needed.  May use mometasone nasal spray 1sprays per nostril 1-2 times a day as needed for nasal congestion. ? Take daily during the spring.   Nasal saline spray (i.e., Simply Saline) or nasal saline lavage (i.e., NeilMed) is recommended as needed and prior to medicated nasal sprays.  May use Pazeo 1 drop in each eye daily as needed for itchy/watery eyes.  Start Singulair (montelukast) 10mg  daily at night. Cautioned that in some children/adults can experience behavioral changes including hyperactivity, agitation, depression, sleep disturbances and suicidal ideations. These side effects are rare, but if you notice them you should notify me and discontinue Singulair (montelukast).  Follow up in 4 months or sooner if needed.   Reducing Pollen Exposure . Pollen seasons: trees (spring), grass (summer) and ragweed/weeds (fall). 06-04-1979 Keep windows closed in your home and car to lower pollen exposure.  Marland Kitchen air conditioning in the bedroom and throughout the house if possible.  . Avoid going out in dry windy days - especially early morning. . Pollen counts are highest between 5 - 10 AM and on dry, hot and windy days.  . Save  outside activities for late afternoon or after a heavy rain, when pollen levels are lower.  . Avoid mowing of grass if you have grass pollen allergy. Dave Luna Be aware that pollen can also be transported indoors on people and pets.  . Dry your clothes in an automatic dryer rather than hanging them outside where they might collect pollen.  . Rinse hair and eyes before bedtime.

## 2019-05-24 ENCOUNTER — Ambulatory Visit: Payer: Self-pay | Attending: Internal Medicine

## 2019-05-24 DIAGNOSIS — Z23 Encounter for immunization: Secondary | ICD-10-CM

## 2019-05-24 NOTE — Progress Notes (Signed)
   Covid-19 Vaccination Clinic  Name:  Dave Luna    MRN: [id number] DOB: 21-Nov-1980  05/24/2019  [name] was observed post Covid-19 immunization for 15 minutes without incident. He was provided with Vaccine Information Sheet and instruction to access the V-Safe system.   [name] was instructed to call 911 with any severe reactions post vaccine: Marland Kitchen Difficulty breathing  . Swelling of face and throat  . A fast heartbeat  . A bad rash all over body  . Dizziness and weakness   Immunizations Administered    Name Date Dose VIS Date Route   Pfizer COVID-19 Vaccine 05/24/2019  8:22 AM 0.3 mL 03/22/2018 Intramuscular   Manufacturer: [employer], [employer]   Lot: [id number]   NDC: [address]-3

## 2019-08-02 ENCOUNTER — Ambulatory Visit (INDEPENDENT_AMBULATORY_CARE_PROVIDER_SITE_OTHER): Payer: BC Managed Care – PPO | Admitting: Internal Medicine

## 2019-08-02 ENCOUNTER — Other Ambulatory Visit: Payer: Self-pay

## 2019-08-02 ENCOUNTER — Encounter: Payer: Self-pay | Admitting: Internal Medicine

## 2019-08-02 VITALS — BP 94/62 | HR 69 | Temp 97.3°F | Wt 192.3 lb

## 2019-08-02 DIAGNOSIS — R238 Other skin changes: Secondary | ICD-10-CM

## 2019-08-02 NOTE — Progress Notes (Signed)
Established Patient Office Visit     This visit occurred during the SARS-CoV-2 public health emergency.  Safety protocols were in place, including screening questions prior to the visit, additional usage of staff PPE, and extensive cleaning of exam room while observing appropriate contact time as indicated for disinfecting solutions.    CC/Reason for Visit: Redness of skin under left eye  HPI: Dave Luna is a 39 y.o. male who is coming in today for the above mentioned reasons. Past Medical History is significant for: Allergic rhinoconjunctivitis and seasonal asthma.  He is still taking Allegra and sometimes uses olopatadine eyedrops.  He has noticed for 2 days some redness and irritation of the skin under his left lower eyelid.  He has been rubbing it quite a bit.  He does not think he has been using any new facial products, soaps, shampoos, no swimming pools.  No eye discharge.   Past Medical/Surgical History: Past Medical History:  Diagnosis Date  . Palpitations     Past Surgical History:  Procedure Laterality Date  . ANTERIOR CRUCIATE LIGAMENT REPAIR Left   . KNEE ARTHROSCOPY Left 10/2016  . None    . WISDOM TOOTH EXTRACTION  08/2018    Social History:  reports that he has quit smoking. He has never used smokeless tobacco. He reports that he does not drink alcohol and does not use drugs.  Allergies: Allergies  Allergen Reactions  . Ciprofloxacin Palpitations    Family History:  Family History  Problem Relation Age of Onset  . Breast cancer Mother   . Atrial fibrillation Father   . Heart disease Maternal Uncle   . Hypertension Maternal Uncle   . Diabetes Paternal Grandfather   . Diabetes Maternal Uncle   . Diabetes Paternal Uncle      Current Outpatient Medications:  .  acetaminophen (TYLENOL) 500 MG tablet, Take 500 mg by mouth as needed for mild pain., Disp: , Rfl:  .  fexofenadine (ALLEGRA) 180 MG tablet, Take 180 mg by mouth daily as needed for  allergies or rhinitis., Disp: , Rfl:  .  ibuprofen (ADVIL,MOTRIN) 200 MG tablet, Take 200 mg by mouth every 6 (six) hours as needed for moderate pain., Disp: , Rfl:  .  levalbuterol (XOPENEX HFA) 45 MCG/ACT inhaler, TAKE 2 PUFFS BY MOUTH EVERY 6 HOURS AS NEEDED FOR WHEEZE OR SHORTNESS OF BREATH, Disp: 15 g, Rfl: 2 .  mometasone (NASONEX) 50 MCG/ACT nasal spray, Place 2 sprays into the nose daily., Disp: 17 g, Rfl: 6 .  montelukast (SINGULAIR) 10 MG tablet, Take 1 tablet (10 mg total) by mouth at bedtime., Disp: 30 tablet, Rfl: 5 .  Olopatadine HCl 0.2 % SOLN, Apply 1 drop to eye daily as needed (itchy/watery eyes)., Disp: 2.5 mL, Rfl: 5  Review of Systems:  Constitutional: Denies fever, chills, diaphoresis, appetite change and fatigue.  HEENT: Denies photophobia, eye pain, redness, hearing loss, ear pain, congestion, sore throat, rhinorrhea, sneezing, mouth sores, trouble swallowing, neck pain, neck stiffness and tinnitus.   Respiratory: Denies SOB, DOE, cough, chest tightness,  and wheezing.   Cardiovascular: Denies chest pain, palpitations and leg swelling.  Gastrointestinal: Denies nausea, vomiting, abdominal pain, diarrhea, constipation, blood in stool and abdominal distention.  Genitourinary: Denies dysuria, urgency, frequency, hematuria, flank pain and difficulty urinating.  Endocrine: Denies: hot or cold intolerance, sweats, changes in hair or nails, polyuria, polydipsia. Musculoskeletal: Denies myalgias, back pain, joint swelling, arthralgias and gait problem.  Skin: Denies pallor, rash and wound.  Neurological: Denies dizziness, seizures, syncope, weakness, light-headedness, numbness and headaches.  Hematological: Denies adenopathy. Easy bruising, personal or family bleeding history  Psychiatric/Behavioral: Denies suicidal ideation, mood changes, confusion, nervousness, sleep disturbance and agitation    Physical Exam: Vitals:   08/02/19 1254  BP: 94/62  Pulse: 69  Temp: (!) 97.3  F (36.3 C)  TempSrc: Temporal  SpO2: 97%  Weight: 192 lb 4.8 oz (87.2 kg)    Body mass index is 30.57 kg/m.   Constitutional: NAD, calm, comfortable Eyes: PERRL, lids and conjunctivae normal, slight erythema of the skin underneath the left lower eyelid, no edema. ENMT: Mucous membranes are moist.  Neurologic: Grossly intact and nonfocal. Psychiatric: Normal judgment and insight. Alert and oriented x 3. Normal mood.    Impression and Plan:  Skin irritation -Have advised that he avoid harsh soaps, scrubs, acne products and try to avoid rubbing his eyes. -He will follow-up if continued issues.      Chaya Jan, MD [facility]

## 2019-09-11 NOTE — Progress Notes (Signed)
Follow Up Note  RE: Dave Luna MRN: [id number] DOB: 1980-05-13 Date of Office Visit: 09/12/2019  Referring provider: [place], Betty G, MD Primary care provider: [place], Betty G, MD  Chief Complaint: Follow-up and Asthma  History of Present Illness: I had the pleasure of seeing Dave Luna for a follow up visit at the [facility] on 09/12/2019. He is a 39 y.o. male, who is being followed for reactive airway disease, allergic rhino conjunctivitis. His previous allergy office visit was on 05/11/2019 with Dr. Selena Batten. Today is a regular follow up visit.  Reactive airway disease Denies any ER/urgent care visits or prednisone use since the last visit. Used xopenex a few times since last visit with good benefit. Using it about every 3 weeks or so mainly when it's humid outside.   Seasonal and perennial allergic rhinoconjunctivitis Singulair has helped with the nasal congestion. Stopped allegra recently and only using it if needed.   Only taking nasal sprays and eye drops if needed.   Having some type of erythema under the eyes for the past 1 month and still there. Initially he thought it was a stye but it's not.  Assessment and Plan: Strider is a 39 y.o. male with: Reactive airway disease Past history - Shortness of breath and wheezing for the past 1 year mainly after spending significant time outdoors. Symptoms resolve the next day if takes additional antihistamine. No previous diagnosis of asthma but had albuterol as a child but stopped due to palpitations. 2020 spirometry showed: No overt abnormalities noted with no significant improvement in FEV1 post bronchodilator treatment.  Interim history - stable and only using xopenex on rare occasions. Humidity seems to be a trigger.  May use levoalbuterol rescue inhaler 2 puffs as needed for shortness of breath, chest tightness, coughing, and wheezing. May use levoalbuterol rescue inhaler 2 puffs 5 to 15 minutes prior to strenuous  physical activities. Monitor frequency of use.   If using it more than 2 times a week let us know and may need daily ICS controller inhaler during certain seasons.   Will get spirometry at next visit instead of today due to COVID-19 pandemic and trying to minimize any type of aerosolizing procedures at this time in the office.   Seasonal and perennial allergic rhinoconjunctivitis Past history - Worsening rhino conjunctivitis symptoms from spring through fall for the last few years. Skin testing 7 years ago was positive to trees, weed, grass, dogs, cats per patient report. No previous allergy immunotherapy. 2020 skin testing showed: Positive to grass, weed, trees, mold, dust mites. Interim history - doing much better since tree pollen season is over. Singulair did help with the congestion. Now taking medications only if needed.  Continue environmental control measures.  May use over the counter antihistamines such as Zyrtec (cetirizine), Claritin (loratadine), Allegra (fexofenadine), or Xyzal (levocetirizine) daily as needed. ? Take daily antihistamine starting in the spring (end of February) and may take it twice a day if needed.   May use mometasone nasal spray 1 spray per nostril 1-2 times a day as needed for nasal congestion. ? Take daily during the spring.   Nasal saline spray (i.e., Simply Saline) or nasal saline lavage (i.e., NeilMed) is recommended as needed and prior to medicated nasal sprays.  May use Pazeo 1 drop in each eye daily as needed for itchy/watery eyes.   Start Singulair (montelukast) 10mg  daily at night during the spring.  Dermatitis Rash under the left eye. Not sure of  trigger.  Use desonide cream twice a day on the rash. Avoid the eyeball. Do not use more than 7 days in a row.  Take pictures if it flares.  If not improved, let us know.   Return in about 7 months (around 04/11/2020).  Meds ordered this encounter  Medications  . desonide (DESOWEN) 0.05 % cream     Sig: Apply topically 2 (two) times daily as needed. Careful to avoid the eyeball. Do not use more than 7 days in a row.    Dispense:  30 g    Refill:  0   Diagnostics: None.  Medication List:  Current Outpatient Medications  Medication Sig Dispense Refill  . acetaminophen (TYLENOL) 500 MG tablet Take 500 mg by mouth as needed for mild pain.    . fexofenadine (ALLEGRA) 180 MG tablet Take 180 mg by mouth daily as needed for allergies or rhinitis.    Marland Kitchen ibuprofen (ADVIL,MOTRIN) 200 MG tablet Take 200 mg by mouth every 6 (six) hours as needed for moderate pain.    Marland Kitchen levalbuterol (XOPENEX HFA) 45 MCG/ACT inhaler TAKE 2 PUFFS BY MOUTH EVERY 6 HOURS AS NEEDED FOR WHEEZE OR SHORTNESS OF BREATH 15 g 2  . mometasone (NASONEX) 50 MCG/ACT nasal spray Place 2 sprays into the nose daily. 17 g 6  . montelukast (SINGULAIR) 10 MG tablet Take 1 tablet (10 mg total) by mouth at bedtime. 30 tablet 5  . Olopatadine HCl 0.2 % SOLN Apply 1 drop to eye daily as needed (itchy/watery eyes). 2.5 mL 5  . desonide (DESOWEN) 0.05 % cream Apply topically 2 (two) times daily as needed. Careful to avoid the eyeball. Do not use more than 7 days in a row. 30 g 0   No current facility-administered medications for this visit.   Allergies: Allergies  Allergen Reactions  . Ciprofloxacin Palpitations   I reviewed his past medical history, social history, family history, and environmental history and no significant changes have been reported from his previous visit.  Review of Systems  Constitutional: Negative for appetite change, chills, fever and unexpected weight change.  HENT: Negative for congestion, rhinorrhea and sneezing.   Eyes: Negative for itching.  Respiratory: Negative for cough, chest tightness, shortness of breath and wheezing.   Cardiovascular: Negative for chest pain.  Gastrointestinal: Negative for abdominal pain.  Genitourinary: Negative for difficulty urinating.  Skin: Positive for rash.    Allergic/Immunologic: Positive for environmental allergies. Negative for food allergies.  Neurological: Negative for headaches.   Objective: BP 112/84   Pulse 71   Temp (!) 97.1 F (36.2 C) (Temporal)   Resp 18   Wt 195 lb 12.8 oz (88.8 kg)   SpO2 98%   BMI 31.13 kg/m  Body mass index is 31.13 kg/m. Physical Exam Vitals and nursing note reviewed.  Constitutional:      Appearance: Normal appearance. He is well-developed.  HENT:     Head: Normocephalic and atraumatic.     Right Ear: Tympanic membrane and external ear normal.     Left Ear: Tympanic membrane and external ear normal.     Nose: Nose normal.     Mouth/Throat:     Mouth: Mucous membranes are moist.     Pharynx: Oropharynx is clear.  Eyes:     Conjunctiva/sclera: Conjunctivae normal.  Cardiovascular:     Rate and Rhythm: Normal rate and regular rhythm.     Heart sounds: Normal heart sounds. No murmur heard.  No friction rub. No gallop.  Pulmonary:     Effort: Pulmonary effort is normal.     Breath sounds: Normal breath sounds. No wheezing, rhonchi or rales.  Musculoskeletal:     Cervical back: Neck supple.  Skin:    General: Skin is warm.     Findings: Rash present.     Comments: Mild erythematous patch on the skin below the left eye.   Neurological:     Mental Status: He is alert and oriented to person, place, and time.  Psychiatric:        Behavior: Behavior normal.    Previous notes and tests were reviewed. The plan was reviewed with the patient/family, and all questions/concerned were addressed.  It was my pleasure to see Velma today and participate in his care. Please feel free to contact me with any questions or concerns.  Sincerely,  Wyline Mood, DO Allergy & Immunology  [facility] office: [phone] [facility] office: [phone] [place] office: [phone]

## 2019-09-12 ENCOUNTER — Encounter: Payer: Self-pay | Admitting: Allergy

## 2019-09-12 ENCOUNTER — Ambulatory Visit (INDEPENDENT_AMBULATORY_CARE_PROVIDER_SITE_OTHER): Payer: BC Managed Care – PPO | Admitting: Allergy

## 2019-09-12 ENCOUNTER — Other Ambulatory Visit: Payer: Self-pay

## 2019-09-12 VITALS — BP 112/84 | HR 71 | Temp 97.1°F | Resp 18 | Wt 195.8 lb

## 2019-09-12 DIAGNOSIS — J302 Other seasonal allergic rhinitis: Secondary | ICD-10-CM

## 2019-09-12 DIAGNOSIS — L309 Dermatitis, unspecified: Secondary | ICD-10-CM

## 2019-09-12 DIAGNOSIS — H1010 Acute atopic conjunctivitis, unspecified eye: Secondary | ICD-10-CM

## 2019-09-12 DIAGNOSIS — J4520 Mild intermittent asthma, uncomplicated: Secondary | ICD-10-CM

## 2019-09-12 DIAGNOSIS — J3089 Other allergic rhinitis: Secondary | ICD-10-CM

## 2019-09-12 MED ORDER — DESONIDE 0.05 % EX CREA: TOPICAL_CREAM | Freq: Two times a day (BID) | CUTANEOUS | 0 refills | Status: DC | PRN

## 2019-09-12 NOTE — Assessment & Plan Note (Signed)
Past history - Shortness of breath and wheezing for the past 1 year mainly after spending significant time outdoors. Symptoms resolve the next day if takes additional antihistamine. No previous diagnosis of asthma but had albuterol as a child but stopped due to palpitations. 2020 spirometry showed: No overt abnormalities noted with no significant improvement in FEV1 post bronchodilator treatment.  Interim history - stable and only using xopenex on rare occasions. Humidity seems to be a trigger.  May use levoalbuterol rescue inhaler 2 puffs as needed for shortness of breath, chest tightness, coughing, and wheezing. May use levoalbuterol rescue inhaler 2 puffs 5 to 15 minutes prior to strenuous physical activities. Monitor frequency of use.   If using it more than 2 times a week let us know and may need daily ICS controller inhaler during certain seasons.   Will get spirometry at next visit instead of today due to COVID-19 pandemic and trying to minimize any type of aerosolizing procedures at this time in the office.

## 2019-09-12 NOTE — Assessment & Plan Note (Signed)
Past history - Worsening rhino conjunctivitis symptoms from spring through fall for the last few years. Skin testing 7 years ago was positive to trees, weed, grass, dogs, cats per patient report. No previous allergy immunotherapy. 2020 skin testing showed: Positive to grass, weed, trees, mold, dust mites. Interim history - doing much better since tree pollen season is over. Singulair did help with the congestion. Now taking medications only if needed.  Continue environmental control measures.  May use over the counter antihistamines such as Zyrtec (cetirizine), Claritin (loratadine), Allegra (fexofenadine), or Xyzal (levocetirizine) daily as needed. ? Take daily antihistamine starting in the spring (end of February) and may take it twice a day if needed.   May use mometasone nasal spray 1 spray per nostril 1-2 times a day as needed for nasal congestion. ? Take daily during the spring.   Nasal saline spray (i.e., Simply Saline) or nasal saline lavage (i.e., NeilMed) is recommended as needed and prior to medicated nasal sprays.  May use Pazeo 1 drop in each eye daily as needed for itchy/watery eyes.   Start Singulair (montelukast) 10mg  daily at night during the spring.

## 2019-09-12 NOTE — Assessment & Plan Note (Signed)
Rash under the left eye. Not sure of trigger.  Use desonide cream twice a day on the rash. Avoid the eyeball. Do not use more than 7 days in a row.  Take pictures if it flares.  If not improved, let us know.

## 2019-09-12 NOTE — Patient Instructions (Addendum)
Reactive airway disease  May use levoalbuterol rescue inhaler 2 puffs as needed for shortness of breath, chest tightness, coughing, and wheezing. May use levoalbuterol rescue inhaler 2 puffs 5 to 15 minutes prior to strenuous physical activities. Monitor frequency of use.   If using it more than 2 times a week let us know and may need daily ICS controller inhaler during certain seasons.   Allergic rhino conjunctivitis  Past skin testing showed: Positive to grass, weed, trees, mold, dust mites.  Continue environmental control measures.  May use over the counter antihistamines such as Zyrtec (cetirizine), Claritin (loratadine), Allegra (fexofenadine), or Xyzal (levocetirizine) daily as needed. ? Take daily antihistamine starting in the spring (end of February) and may take it twice a day if needed.   May use mometasone nasal spray 1sprays per nostril 1-2 times a day as needed for nasal congestion. ? Take daily during the spring.   Nasal saline spray (i.e., Simply Saline) or nasal saline lavage (i.e., NeilMed) is recommended as needed and prior to medicated nasal sprays.  May use Pazeo 1 drop in each eye daily as needed for itchy/watery eyes.   Start Singulair (montelukast) 10mg  daily at night during the spring.  Rash  Use desonide cream twice a day on the rash. Avoid the eyeball. Do not use more than 7 days in a row.  Take pictures if it flares.  If not improved, let 08-17-1990 know.   Follow up in 7 months or sooner if needed.   Reducing Pollen Exposure . Pollen seasons: trees (spring), grass (summer) and ragweed/weeds (fall). 06-04-1980 Keep windows closed in your home and car to lower pollen exposure.  Marland Kitchen air conditioning in the bedroom and throughout the house if possible.  . Avoid going out in dry windy days - especially early morning. . Pollen counts are highest between 5 - 10 AM and on dry, hot and windy days.  . Save outside activities for late afternoon or after a heavy rain,  when pollen levels are lower.  . Avoid mowing of grass if you have grass pollen allergy. Lilian Kapur Be aware that pollen can also be transported indoors on people and pets.  . Dry your clothes in an automatic dryer rather than hanging them outside where they might collect pollen.  . Rinse hair and eyes before bedtime.

## 2019-10-16 ENCOUNTER — Ambulatory Visit (INDEPENDENT_AMBULATORY_CARE_PROVIDER_SITE_OTHER): Payer: BC Managed Care – PPO | Admitting: Family Medicine

## 2019-10-16 ENCOUNTER — Other Ambulatory Visit: Payer: Self-pay

## 2019-10-16 ENCOUNTER — Encounter: Payer: Self-pay | Admitting: Family Medicine

## 2019-10-16 VITALS — BP 122/70 | HR 96 | Temp 98.2°F | Resp 16 | Ht 66.5 in | Wt 197.4 lb

## 2019-10-16 DIAGNOSIS — B359 Dermatophytosis, unspecified: Secondary | ICD-10-CM | POA: Diagnosis not present

## 2019-10-16 DIAGNOSIS — L298 Other pruritus: Secondary | ICD-10-CM | POA: Diagnosis not present

## 2019-10-16 DIAGNOSIS — Z23 Encounter for immunization: Secondary | ICD-10-CM | POA: Diagnosis not present

## 2019-10-16 MED ORDER — DESONIDE 0.05 % EX OINT: 1.0000 "application " | TOPICAL_OINTMENT | Freq: Two times a day (BID) | CUTANEOUS | 0 refills | Status: AC

## 2019-10-16 MED ORDER — CLOTRIMAZOLE-BETAMETHASONE 1-0.05 % EX CREA: 1.0000 "application " | TOPICAL_CREAM | Freq: Two times a day (BID) | CUTANEOUS | 1 refills | Status: DC

## 2019-10-16 MED ORDER — KETOCONAZOLE 2 % EX CREA: 1.0000 "application " | TOPICAL_CREAM | Freq: Every day | CUTANEOUS | 0 refills | Status: DC

## 2019-10-16 NOTE — Patient Instructions (Addendum)
A few things to remember from today's visit:   Pruritic erythematous rash - Plan: ketoconazole (NIZORAL) 2 % cream, desonide (DESOWEN) 0.05 % ointment  Tinea - Plan: clotrimazole-betamethasone (LOTRISONE) cream  Desonide small amount on eye lids 2 times daily for 2 weeks. Ketoconazole cram one daily on affected area on face. Baby Johnson shampoo to clean eye lids.  Lotrisone on wrist lesions, ? Fungal.  Let me know in 3-4 days if you have not noted any improvement.  If you need refills please call your pharmacy. Do not use My Chart to request refills or for acute issues that need immediate attention.    Please be sure medication list is accurate. If a new problem present, please set up appointment sooner than planned today.

## 2019-10-16 NOTE — Progress Notes (Addendum)
Chief Complaint  Patient presents with  . Rash    under eye, has used hydrocortisone cream   HPI: Dave Luna is a 39 y.o. male with hx of seasonal allergies and RAD here today complaining of left lower eye lid pruritic erythematous rash noted at least 2 months ago. Problem is constant. No prior hx.  Negative for new medication, detergent, soap, or body product. No known insect bite or outdoor exposures to plants. No sick contact.  OTC medication for this problem: Hydrocortisone, which helps but rash comes back when he stops medication.  Negative for conjunctival erythema,eye drainage,visual changes, oral lesions/edema,cough, wheezing, dyspnea, abdominal pain, nausea, or vomiting. 3 days ago he had some serosanguinous exudate.  Noted lesions on wrist here in the office. Lesions are not pruritic. No associated joint pain or erythema.  Review of Systems  Constitutional: Negative for activity change, appetite change, chills, fatigue and fever.  HENT: Negative for congestion and sore throat.   Cardiovascular: Negative for chest pain, palpitations and leg swelling.  Skin: Negative for pallor and wound.  Allergic/Immunologic: Positive for environmental allergies.  Neurological: Negative for numbness and headaches.  Hematological: Negative for adenopathy. Does not bruise/bleed easily.  Rest see pertinent positives and negatives per HPI.  Current Outpatient Medications on File Prior to Visit  Medication Sig Dispense Refill  . acetaminophen (TYLENOL) 500 MG tablet Take 500 mg by mouth as needed for mild pain.    Marland Kitchen desonide (DESOWEN) 0.05 % cream Apply topically 2 (two) times daily as needed. Careful to avoid the eyeball. Do not use more than 7 days in a row. 30 g 0  . fexofenadine (ALLEGRA) 180 MG tablet Take 180 mg by mouth daily as needed for allergies or rhinitis.    Marland Kitchen ibuprofen (ADVIL,MOTRIN) 200 MG tablet Take 200 mg by mouth every 6 (six) hours as needed for moderate pain.     Marland Kitchen levalbuterol (XOPENEX HFA) 45 MCG/ACT inhaler TAKE 2 PUFFS BY MOUTH EVERY 6 HOURS AS NEEDED FOR WHEEZE OR SHORTNESS OF BREATH 15 g 2  . mometasone (NASONEX) 50 MCG/ACT nasal spray Place 2 sprays into the nose daily. 17 g 6  . montelukast (SINGULAIR) 10 MG tablet Take 1 tablet (10 mg total) by mouth at bedtime. 30 tablet 5  . Olopatadine HCl 0.2 % SOLN Apply 1 drop to eye daily as needed (itchy/watery eyes). 2.5 mL 5   No current facility-administered medications on file prior to visit.     Past Medical History:  Diagnosis Date  . Palpitations    Allergies  Allergen Reactions  . Ciprofloxacin Palpitations    Social History   Socioeconomic History  . Marital status: Single    Spouse name: Not on file  . Number of children: 0  . Years of education: Not on file  . Highest education level: Master's degree (e.g., MA, MS, MEng, MEd, MSW, MBA)  Occupational History  . 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 Smoking status: Former Games developer  . Smokeless tobacco: Never Used  Vaping Use  . Vaping Use: Never used  Substance and Sexual Activity  . Alcohol use: No  . Drug use: No  . Sexual activity: Not on file  Other Topics Concern  . Not on file  Social History Narrative   Patient is a single right-handed male. He drinks one cup of coffee and tea a day. He exercises 3-4 x a week. He has a 2nd floor  apartment.   Social Determinants of Health   Financial Resource Strain:   . Difficulty of Paying Living Expenses: Not on file  Food Insecurity:   . Worried About Programme researcher, broadcasting/film/video in the Last Year: Not on file  . Ran Out of Food in the Last Year: Not on file  Transportation Needs:   . Lack of Transportation (Medical): Not on file  . Lack of Transportation (Non-Medical): Not on file  Physical Activity:   . Days of Exercise per Week: Not on file  . Minutes of Exercise per Session: Not on file  Stress:   . Feeling of Stress : Not on file    Social Connections:   . Frequency of Communication with Friends and Family: Not on file  . Frequency of Social Gatherings with Friends and Family: Not on file  . Attends Religious Services: Not on file  . Active Member of Clubs or Organizations: Not on file  . Attends Banker Meetings: Not on file  . Marital Status: Not on file    Vitals:   10/16/19 1623  BP: 122/70  Pulse: 96  Resp: 16  Temp: 98.2 F (36.8 C)  SpO2: 97%   Body mass index is 31.38 kg/m.  Physical Exam Vitals and nursing note reviewed.  Constitutional:      General: He is not in acute distress.    Appearance: Normal appearance. He is well-developed.  HENT:     Head: Normocephalic and atraumatic.      Mouth/Throat:     Mouth: Mucous membranes are moist.     Pharynx: Oropharynx is clear.  Eyes:     Conjunctiva/sclera: Conjunctivae normal.  Neck:     Trachea: No tracheal deviation.  Cardiovascular:     Rate and Rhythm: Normal rate.  Pulmonary:     Effort: Pulmonary effort is normal. No respiratory distress.     Breath sounds: Normal breath sounds.  Lymphadenopathy:     Cervical: No cervical adenopathy.  Skin:    General: Skin is warm.     Findings: Rash present. No erythema.          Comments: On lower left eye lid with erythematous confluent rash, no local heat or tenderness. Right lower eye lid with mildly erythematous + scaly area around temporal eye angle.   On dorsal aspect of wrist rounded macular lesions with clear center and fine scales. About 2.5 cm diameter. Not tender.  See HENT and musc graphics.   Neurological:     General: No focal deficit present.     Mental Status: He is alert and oriented to person, place, and time.     Cranial Nerves: No cranial nerve deficit.     Motor: No weakness.     Gait: Gait normal.  Psychiatric:        Mood and Affect: Mood and affect normal.     Comments: Well groomed, good eye contact.   ASSESSMENT AND PLAN:  Dave Luna was seen  today for rash.  Diagnoses and all orders for this visit:  Pruritic erythematous rash Periocular. We discussed possible etiologies, ? Seborrheic dermatitis. I do not think it is a bacterial process, so abx not recommended. Topical steroid ointment, very small amount bid x 14 days and Ketoconazole. Instructed about warning signs. If not greatly improved in 3-4 days dermatology referral will be recommended.  -     ketoconazole (NIZORAL) 2 % cream; Apply 1 application topically daily. -     desonide (DESOWEN)  0.05 % ointment; Apply 1 application topically 2 (two) times daily for 14 days. For up to 2 weeks.  Tinea On wrist. Recommend topical Lotrisone bid x 2 weeks.  -     clotrimazole-betamethasone (LOTRISONE) cream; Apply 1 application topically 2 (two) times daily.  Need for influenza vaccination -     Flu Vaccine QUAD 36+ mos IM  Return if symptoms worsen or fail to improve.   Dave Christiano G. [place], MD  [facility]. [place] office.  A few things to remember from today's visit:   Pruritic erythematous rash - Plan: ketoconazole (NIZORAL) 2 % cream, desonide (DESOWEN) 0.05 % ointment  Tinea - Plan: clotrimazole-betamethasone (LOTRISONE) cream  Desonide small amount on eye lids 2 times daily for 2 weeks. Ketoconazole cram one daily on affected area on face. Baby Johnson shampoo to clean eye lids.  Lotrisone on wrist lesions, ? Fungal.  Let me know in 3-4 days if you have not noted any improvement.  If you need refills please call your pharmacy. Do not use My Chart to request refills or for acute issues that need immediate attention.    Please be sure medication list is accurate. If a new problem present, please set up appointment sooner than planned today.

## 2019-10-24 ENCOUNTER — Encounter: Payer: Self-pay | Admitting: Family Medicine

## 2019-11-06 ENCOUNTER — Telehealth: Payer: Self-pay | Admitting: Family Medicine

## 2019-11-06 NOTE — Telephone Encounter (Signed)
Pt was seen on 10/16/19 for Pruritic erythematous rash. He says his eyes are irritating him again. He is wondering if he should use his script for  desonide (DESOWEN) 0.05 % cream [[id number]. He was told not to use this more than 2 weeks. Please advise at [phone].

## 2019-11-08 NOTE — Telephone Encounter (Signed)
Patient has an appt for Friday.

## 2019-11-10 ENCOUNTER — Ambulatory Visit (INDEPENDENT_AMBULATORY_CARE_PROVIDER_SITE_OTHER): Payer: BC Managed Care – PPO | Admitting: Family Medicine

## 2019-11-10 ENCOUNTER — Encounter: Payer: Self-pay | Admitting: Family Medicine

## 2019-11-10 ENCOUNTER — Other Ambulatory Visit: Payer: Self-pay

## 2019-11-10 VITALS — BP 118/70 | HR 73 | Resp 12 | Ht 66.5 in | Wt 198.4 lb

## 2019-11-10 DIAGNOSIS — L298 Other pruritus: Secondary | ICD-10-CM

## 2019-11-10 NOTE — Progress Notes (Signed)
Chief Complaint  Patient presents with  . eye rash   HPI: [name] is a 39 y.o. male, who is here today complaining of recurrent periocular erythematous pruritic rash. He was seen on 10/16/19 for this problem.  Problem started in 07/2019, gradual. Initially is was affecting ,ainly left side but now right side is getting worse.  Negative forany new medication, detergent, soap, or body product. No known insect bite or outdoor exposures to plants. No sick contact. Hx of eczema but has not had similar rash in the past.  Desonide and Ketoconazole helped to clear lesions, reappeared a week after treatment was discontinued. Resumed Desonide yesterday.  He has not identified exacerbating factors.  Negative for conjunctival erythema,visual changes, drainage, oral lesions/edema,cough, wheezing, dyspnea, abdominal pain, nausea, or vomiting.  Review of Systems  Constitutional: Negative for activity change, appetite change and fatigue.  Genitourinary: Negative for decreased urine volume and hematuria.  Musculoskeletal: Negative for arthralgias, joint swelling and myalgias.  Allergic/Immunologic: Positive for environmental allergies.  Rest see pertinent positives and negatives per HPI.  Current Outpatient Medications on File Prior to Visit  Medication Sig Dispense Refill  . acetaminophen (TYLENOL) 500 MG tablet Take 500 mg by mouth as needed for mild pain.    . clotrimazole-betamethasone (LOTRISONE) cream Apply 1 application topically 2 (two) times daily. 30 g 1  . desonide (DESOWEN) 0.05 % cream Apply topically 2 (two) times daily as needed. Careful to avoid the eyeball. Do not use more than 7 days in a row. 30 g 0  . fexofenadine (ALLEGRA) 180 MG tablet Take 180 mg by mouth daily as needed for allergies or rhinitis.    Marland Kitchen ibuprofen (ADVIL,MOTRIN) 200 MG tablet Take 200 mg by mouth every 6 (six) hours as needed for moderate pain.    Marland Kitchen ketoconazole (NIZORAL) 2 % cream Apply 1  application topically daily. 15 g 0  . levalbuterol (XOPENEX HFA) 45 MCG/ACT inhaler TAKE 2 PUFFS BY MOUTH EVERY 6 HOURS AS NEEDED FOR WHEEZE OR SHORTNESS OF BREATH 15 g 2  . mometasone (NASONEX) 50 MCG/ACT nasal spray Place 2 sprays into the nose daily. 17 g 6  . montelukast (SINGULAIR) 10 MG tablet Take 1 tablet (10 mg total) by mouth at bedtime. 30 tablet 5  . Olopatadine HCl 0.2 % SOLN Apply 1 drop to eye daily as needed (itchy/watery eyes). 2.5 mL 5   No current facility-administered medications on file prior to visit.     Past Medical History:  Diagnosis Date  . Palpitations    Allergies  Allergen Reactions  . Ciprofloxacin Palpitations    Social History   Socioeconomic History  . Marital status: Single    Spouse name: Not on file  . Number of children: 0  . Years of education: Not on file  . Highest education level: Master's degree (e.g., MA, MS, MEng, MEd, MSW, MBA)  Occupational History  . 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 Smoking status: Former Games developer  . Smokeless tobacco: Never Used  Vaping Use  . Vaping Use: Never used  Substance and Sexual Activity  . Alcohol use: No  . Drug use: No  . Sexual activity: Not on file  Other Topics Concern  . Not on file  Social History Narrative   Patient is a single right-handed male. He drinks one cup of coffee and tea a day. He exercises 3-4 x a week. He has a 2nd floor  apartment.   Social Determinants of Health   Financial Resource Strain:   . Difficulty of Paying Living Expenses: Not on file  Food Insecurity:   . Worried About Programme researcher, broadcasting/film/video in the Last Year: Not on file  . Ran Out of Food in the Last Year: Not on file  Transportation Needs:   . Lack of Transportation (Medical): Not on file  . Lack of Transportation (Non-Medical): Not on file  Physical Activity:   . Days of Exercise per Week: Not on file  . Minutes of Exercise per Session: Not on file  Stress:     . Feeling of Stress : Not on file  Social Connections:   . Frequency of Communication with Friends and Family: Not on file  . Frequency of Social Gatherings with Friends and Family: Not on file  . Attends Religious Services: Not on file  . Active Member of Clubs or Organizations: Not on file  . Attends Banker Meetings: Not on file  . Marital Status: Not on file    Vitals:   11/10/19 1357  BP: 118/70  Pulse: 73  Resp: 12  SpO2: 97%   Body mass index is 31.54 kg/m.  Physical Exam Vitals and nursing note reviewed.  HENT:     Head: Normocephalic and atraumatic.     Mouth/Throat:     Mouth: Mucous membranes are moist.     Pharynx: Oropharynx is clear.  Eyes:     Conjunctiva/sclera: Conjunctivae normal.     Pupils: Pupils are equal, round, and reactive to light.   Pulmonary:     Effort: Pulmonary effort is normal.  Lymphadenopathy:     Cervical: No cervical adenopathy.  Skin:    Comments: Finely scaly, erythematous rash on lower eye lids. No edema, exudate,or tenderness.  Neurological:     General: No focal deficit present.     Mental Status: He is alert and oriented to person, place, and time.     Cranial Nerves: Cranial nerves are intact.    ASSESSMENT AND PLAN:  [name]  Diagnoses and all orders for this visit:  Pruritic erythematous rash -     Ambulatory referral to Dermatology   We discussed some side effects of topical steroid, Continuing desonide cream a very small amount daily as needed. Dermatology referral placed, he will arrange his appointment. KY or other water-based jelly form may help with dry skin. Instructed about warning signs.  Return if symptoms worsen or fail to improve.   Zak Gondek G. [place], MD  [facility]. [place] office.   A few things to remember from today's visit:  Pruritic erythematous rash - Plan: Ambulatory referral to Dermatology  If you need refills please call your  pharmacy. Do not use My Chart to request refills or for acute issues that need immediate attention. Appointment with dermatology will be arranged. You can apply over-the-counter KY. Continue topical steroid, small amount.  Please be sure medication list is accurate. If a new problem present, please set up appointment sooner than planned today.

## 2019-11-10 NOTE — Patient Instructions (Signed)
A few things to remember from today's visit:  Pruritic erythematous rash - Plan: Ambulatory referral to Dermatology  If you need refills please call your pharmacy. Do not use My Chart to request refills or for acute issues that need immediate attention. Appointment with dermatology will be arranged. You can apply over-the-counter KY. Continue topical steroid, small amount.  Please be sure medication list is accurate. If a new problem present, please set up appointment sooner than planned today.

## 2019-11-12 ENCOUNTER — Encounter: Payer: Self-pay | Admitting: Family Medicine

## 2019-11-15 DIAGNOSIS — L239 Allergic contact dermatitis, unspecified cause: Secondary | ICD-10-CM | POA: Diagnosis not present

## 2019-11-16 DIAGNOSIS — H01112 Allergic dermatitis of right lower eyelid: Secondary | ICD-10-CM | POA: Diagnosis not present

## 2019-11-17 ENCOUNTER — Other Ambulatory Visit: Payer: Self-pay | Admitting: Allergy

## 2019-11-17 ENCOUNTER — Other Ambulatory Visit: Payer: Self-pay

## 2019-11-17 MED ORDER — MONTELUKAST SODIUM 10 MG PO TABS: 10.0000 mg | ORAL_TABLET | Freq: Every day | ORAL | 5 refills | Status: AC

## 2019-11-27 ENCOUNTER — Other Ambulatory Visit: Payer: Self-pay

## 2019-11-27 ENCOUNTER — Ambulatory Visit (INDEPENDENT_AMBULATORY_CARE_PROVIDER_SITE_OTHER): Payer: BC Managed Care – PPO | Admitting: Allergy

## 2019-11-27 ENCOUNTER — Encounter: Payer: Self-pay | Admitting: Allergy

## 2019-11-27 VITALS — BP 108/62 | HR 91 | Temp 98.5°F | Resp 16

## 2019-11-27 DIAGNOSIS — J4520 Mild intermittent asthma, uncomplicated: Secondary | ICD-10-CM

## 2019-11-27 DIAGNOSIS — L309 Dermatitis, unspecified: Secondary | ICD-10-CM

## 2019-11-27 DIAGNOSIS — H1010 Acute atopic conjunctivitis, unspecified eye: Secondary | ICD-10-CM

## 2019-11-27 DIAGNOSIS — J302 Other seasonal allergic rhinitis: Secondary | ICD-10-CM | POA: Diagnosis not present

## 2019-11-27 DIAGNOSIS — J3089 Other allergic rhinitis: Secondary | ICD-10-CM

## 2019-11-27 NOTE — Assessment & Plan Note (Signed)
Saw dermatology and prescribed a stronger topical cream which helped but seems to be thinning his skin. Desonide helps as long as he was using it.  Use desonide cream twice a day on the rash. Avoid the eyeball. Do not use more than 7 days in a row.  Take pictures if it flares.  See below for proper skin care.  Recommend patch testing next.   Patches are best placed on Monday with return to office on Wednesday and Friday of same week for readings.  Patches once placed should not get wet.  You do not have to stop any medications for patch testing but should not be on oral prednisone. You can schedule a patch testing visit when convenient for your schedule.    Call your insurance about patch testing costs:  CPT code for patch testing is 95044 per allergen and there's 36 allergen on our patch testing.

## 2019-11-27 NOTE — Assessment & Plan Note (Signed)
Past history - Worsening rhino conjunctivitis symptoms from spring through fall for the last few years. Skin testing 7 years ago was positive to trees, weed, grass, dogs, cats per patient report. No previous allergy immunotherapy. 2020 skin testing showed: Positive to grass, weed, trees, mold, dust mites. Interim history - asymptomatic with no medications.   Continue environmental control measures.  May use over the counter antihistamines such as Zyrtec (cetirizine), Claritin (loratadine), Allegra (fexofenadine), or Xyzal (levocetirizine) daily as needed. ? Take daily antihistamine starting in the spring (end of February) and may take it twice a day if needed.   May use mometasone nasal spray 1 spray per nostril 1-2 times a day as needed for nasal congestion. ? Take daily during the spring.   Nasal saline spray (i.e., Simply Saline) or nasal saline lavage (i.e., NeilMed) is recommended as needed and prior to medicated nasal sprays.  May use Pazeo 1 drop in each eye daily as needed for itchy/watery eyes.   Start Singulair (montelukast) 10mg  daily at night during the spring.

## 2019-11-27 NOTE — Patient Instructions (Addendum)
Reactive airway disease  May use levoalbuterol rescue inhaler 2 puffs as needed for shortness of breath, chest tightness, coughing, and wheezing. May use levoalbuterol rescue inhaler 2 puffs 5 to 15 minutes prior to strenuous physical activities. Monitor frequency of use.   If using it more than 2 times a week let us know and may need daily ICS controller inhaler during certain seasons.   Allergic rhino conjunctivitis  Past skin testing showed: Positive to grass, weed, trees, mold, dust mites.  Continue environmental control measures.  May use over the counter antihistamines such as Zyrtec (cetirizine), Claritin (loratadine), Allegra (fexofenadine), or Xyzal (levocetirizine) daily as needed. ? Take daily antihistamine starting in the spring (end of February) and may take it twice a day if needed.   May use mometasone nasal spray 1spray per nostril 1-2 times a day as needed for nasal congestion. ? Take daily during the spring.   Nasal saline spray (i.e., Simply Saline) or nasal saline lavage (i.e., NeilMed) is recommended as needed and prior to medicated nasal sprays.  May use Pazeo 1 drop in each eye daily as needed for itchy/watery eyes.   Start Singulair (montelukast) 10mg  daily at night during the spring.  Rash  Use desonide cream twice a day on the rash. Avoid the eyeball. Do not use more than 7 days in a row.  Take pictures if it flares.  See below for proper skin care.  Recommend patch testing next.   Patches are best placed on Monday with return to office on Wednesday and Friday of same week for readings.  Patches once placed should not get wet.  You do not have to stop any medications for patch testing but should not be on oral prednisone. You can schedule a patch testing visit when convenient for your schedule.    Call your insurance about patch testing costs:  CPT code for patch testing is 95044 per allergen and there's 36 allergen on our patch testing.   Follow up  for patch testing.   Reducing Pollen Exposure . Pollen seasons: trees (spring), grass (summer) and ragweed/weeds (fall). 06-04-1979 Keep windows closed in your home and car to lower pollen exposure.  Marland Kitchen air conditioning in the bedroom and throughout the house if possible.  . Avoid going out in dry windy days - especially early morning. . Pollen counts are highest between 5 - 10 AM and on dry, hot and windy days.  . Save outside activities for late afternoon or after a heavy rain, when pollen levels are lower.  . Avoid mowing of grass if you have grass pollen allergy. Lilian Kapur Be aware that pollen can also be transported indoors on people and pets.  . Dry your clothes in an automatic dryer rather than hanging them outside where they might collect pollen.  . Rinse hair and eyes before bedtime.  Skin care recommendations  Bath time: . Always use lukewarm water. AVOID very hot or cold water. Marland Kitchen Keep bathing time to 5-10 minutes. . Do NOT use bubble bath. . Use a mild soap and use just enough to wash the dirty areas. . Do NOT scrub skin vigorously.  . After bathing, pat dry your skin with a towel. Do NOT rub or scrub the skin.  Moisturizers and prescriptions:  . ALWAYS apply moisturizers immediately after bathing (within 3 minutes). This helps to lock-in moisture. . Use the moisturizer several times a day over the whole body. Marland Kitchen summer moisturizers include: Aveeno, CeraVe, Cetaphil. Peri Jefferson winter  moisturizers include: Aquaphor, Vaseline, Cerave, Cetaphil, Eucerin, Vanicream. . When using moisturizers along with medications, the moisturizer should be applied about one hour after applying the medication to prevent diluting effect of the medication or moisturize around where you applied the medications. When not using medications, the moisturizer can be continued twice daily as maintenance.  Laundry and clothing: . Avoid laundry products with added color or perfumes. . Use unscented  hypo-allergenic laundry products such as Tide free, Cheer free & gentle, and All free and clear.  . If the skin still seems dry or sensitive, you can try double-rinsing the clothes. . Avoid tight or scratchy clothing such as wool. . Do not use fabric softeners or dyer sheets.

## 2019-11-27 NOTE — Assessment & Plan Note (Signed)
Past history - Shortness of breath and wheezing for the past 1 year mainly after spending significant time outdoors. Symptoms resolve the next day if takes additional antihistamine. No previous diagnosis of asthma but had albuterol as a child but stopped due to palpitations. 2020 spirometry showed: No overt abnormalities noted with no significant improvement in FEV1 post bronchodilator treatment. Trigger: humidity. Interim history - asymptomatic with no albuterol use.   May use levoalbuterol rescue inhaler 2 puffs as needed for shortness of breath, chest tightness, coughing, and wheezing. May use levoalbuterol rescue inhaler 2 puffs 5 to 15 minutes prior to strenuous physical activities. Monitor frequency of use.   If using it more than 2 times a week let us know and may need daily ICS controller inhaler during certain seasons.

## 2019-11-27 NOTE — Progress Notes (Signed)
Follow Up Note  RE: Dave Luna MRN: [id number] DOB: 10-05-1980 Date of Office Visit: 11/27/2019  Referring provider: [place], Betty G, MD Primary care provider: [place], Betty G, MD  Chief Complaint: Eczema (under eyes and hands )  History of Present Illness: I had the pleasure of seeing Dave Luna for a follow up visit at the [facility] on 11/27/2019. He is a 39 y.o. male, who is being followed for reactive airway disease, allergic rhinoconjunctivitis and dermatitis. His previous allergy office visit was on 09/12/2019 with Dr. Selena Batten. Today is a regular follow up visit.  Reactive airway disease Denies any SOB, coughing, wheezing, chest tightness, nocturnal awakenings, ER/urgent care visits or prednisone use since the last visit. No albuterol use since the last visit since humidity is down.  Seasonal and perennial allergic rhinoconjunctivitis Currently off all medications with no symptoms  Dermatitis Patient went to see dermatologist and having issues with the eyes and hands. Used desonide with good benefit but the rash returns once he stopped it. Apparently dermatology gave him a stronger topical steroid cream which seemed to thin out the skin under his eyes so he stopped using it.  He is trying to switch his personal care products with some benefit.  Assessment and Plan: Dave Luna is a 39 y.o. male with: Reactive airway disease Past history - Shortness of breath and wheezing for the past 1 year mainly after spending significant time outdoors. Symptoms resolve the next day if takes additional antihistamine. No previous diagnosis of asthma but had albuterol as a child but stopped due to palpitations. 2020 spirometry showed: No overt abnormalities noted with no significant improvement in FEV1 post bronchodilator treatment. Trigger: humidity. Interim history - asymptomatic with no albuterol use.   May use levoalbuterol rescue inhaler 2 puffs as needed for shortness of  breath, chest tightness, coughing, and wheezing. May use levoalbuterol rescue inhaler 2 puffs 5 to 15 minutes prior to strenuous physical activities. Monitor frequency of use.   If using it more than 2 times a week let us know and may need daily ICS controller inhaler during certain seasons.   Seasonal and perennial allergic rhinoconjunctivitis Past history - Worsening rhino conjunctivitis symptoms from spring through fall for the last few years. Skin testing 7 years ago was positive to trees, weed, grass, dogs, cats per patient report. No previous allergy immunotherapy. 2020 skin testing showed: Positive to grass, weed, trees, mold, dust mites. Interim history - asymptomatic with no medications.   Continue environmental control measures.  May use over the counter antihistamines such as Zyrtec (cetirizine), Claritin (loratadine), Allegra (fexofenadine), or Xyzal (levocetirizine) daily as needed. ? Take daily antihistamine starting in the spring (end of February) and may take it twice a day if needed.   May use mometasone nasal spray 1 spray per nostril 1-2 times a day as needed for nasal congestion. ? Take daily during the spring.   Nasal saline spray (i.e., Simply Saline) or nasal saline lavage (i.e., NeilMed) is recommended as needed and prior to medicated nasal sprays.  May use Pazeo 1 drop in each eye daily as needed for itchy/watery eyes.   Start Singulair (montelukast) 10mg  daily at night during the spring.  Dermatitis Saw dermatology and prescribed a stronger topical cream which helped but seems to be thinning his skin. Desonide helps as long as he was using it.  Use desonide cream twice a day on the rash. Avoid the eyeball. Do not use more than 7 days in  a row.  Take pictures if it flares.  See below for proper skin care.  Recommend patch testing next.   Patches are best placed on Monday with return to office on Wednesday and Friday of same week for readings.  Patches once  placed should not get wet.  You do not have to stop any medications for patch testing but should not be on oral prednisone. You can schedule a patch testing visit when convenient for your schedule.    Call your insurance about patch testing costs:  CPT code for patch testing is 95044 per allergen and there's 36 allergen on our patch testing.   Return for Patch testing.  Diagnostics: None.  Medication List:  Current Outpatient Medications  Medication Sig Dispense Refill  . acetaminophen (TYLENOL) 500 MG tablet Take 500 mg by mouth as needed for mild pain.    . clotrimazole-betamethasone (LOTRISONE) cream Apply 1 application topically 2 (two) times daily. 30 g 1  . desonide (DESOWEN) 0.05 % cream Apply topically 2 (two) times daily as needed. Careful to avoid the eyeball. Do not use more than 7 days in a row. 30 g 0  . fexofenadine (ALLEGRA) 180 MG tablet Take 180 mg by mouth daily as needed for allergies or rhinitis.    . fluticasone (CUTIVATE) 0.05 % cream SMARTSIG:1 Topical Every Night    . ibuprofen (ADVIL,MOTRIN) 200 MG tablet Take 200 mg by mouth every 6 (six) hours as needed for moderate pain.    Marland Kitchen ketoconazole (NIZORAL) 2 % cream Apply 1 application topically daily. 15 g 0  . levalbuterol (XOPENEX HFA) 45 MCG/ACT inhaler TAKE 2 PUFFS BY MOUTH EVERY 6 HOURS AS NEEDED FOR WHEEZE OR SHORTNESS OF BREATH 15 g 2  . mometasone (NASONEX) 50 MCG/ACT nasal spray Place 2 sprays into the nose daily. 17 g 6  . montelukast (SINGULAIR) 10 MG tablet TAKE 1 TABLET BY MOUTH EVERYDAY AT BEDTIME 90 tablet 1  . montelukast (SINGULAIR) 10 MG tablet Take 1 tablet (10 mg total) by mouth at bedtime. 30 tablet 5  . Olopatadine HCl 0.2 % SOLN Apply 1 drop to eye daily as needed (itchy/watery eyes). 2.5 mL 5   No current facility-administered medications for this visit.   Allergies: Allergies  Allergen Reactions  . Ciprofloxacin Palpitations   I reviewed his past medical history, social history, family  history, and environmental history and no significant changes have been reported from his previous visit.  Review of Systems  Constitutional: Negative for appetite change, chills, fever and unexpected weight change.  HENT: Negative for congestion, rhinorrhea and sneezing.   Eyes: Negative for itching.  Respiratory: Negative for cough, chest tightness, shortness of breath and wheezing.   Cardiovascular: Negative for chest pain.  Gastrointestinal: Negative for abdominal pain.  Genitourinary: Negative for difficulty urinating.  Skin: Positive for rash.  Allergic/Immunologic: Positive for environmental allergies. Negative for food allergies.  Neurological: Negative for headaches.   Objective: BP 108/62   Pulse 91   Temp 98.5 F (36.9 C) (Temporal)   Resp 16   SpO2 98%  There is no height or weight on file to calculate BMI. Physical Exam Vitals and nursing note reviewed.  Constitutional:      Appearance: Normal appearance. He is well-developed.  HENT:     Head: Normocephalic and atraumatic.     Right Ear: Tympanic membrane and external ear normal.     Left Ear: Tympanic membrane and external ear normal.     Nose: Nose normal.  Mouth/Throat:     Mouth: Mucous membranes are moist.     Pharynx: Oropharynx is clear.  Eyes:     Conjunctiva/sclera: Conjunctivae normal.  Cardiovascular:     Rate and Rhythm: Normal rate and regular rhythm.     Heart sounds: Normal heart sounds. No murmur heard.  No friction rub. No gallop.   Pulmonary:     Effort: Pulmonary effort is normal.     Breath sounds: Normal breath sounds. No wheezing, rhonchi or rales.  Musculoskeletal:     Cervical back: Neck supple.  Skin:    General: Skin is warm.     Findings: Rash present.     Comments: Mild erythematous patch on the skin below the eyes b/l. Few patches on the webs of his hands as well.  Neurological:     Mental Status: He is alert and oriented to person, place, and time.  Psychiatric:         Behavior: Behavior normal.    Previous notes and tests were reviewed. The plan was reviewed with the patient/family, and all questions/concerned were addressed.  It was my pleasure to see Dave Luna today and participate in his care. Please feel free to contact me with any questions or concerns.  Sincerely,  Wyline Mood, DO Allergy & Immunology  [facility] office: [phone] [facility] office: [phone]

## 2019-12-14 ENCOUNTER — Telehealth: Payer: Self-pay

## 2019-12-14 NOTE — Telephone Encounter (Signed)
Candice can you please advise?  I'm not sure what this entails if there are forms to fill out. Can you ask Candice?   But here's the diagnosis code: L23.09 (allergic contact dermatitis) CPT code for patch testing is 95044 per allergen and there's 36 allergen on the TRUE patch test.     Thank you.

## 2019-12-14 NOTE — Telephone Encounter (Signed)
I'm not sure what this entails if there are forms to fill out. Can you ask Candice?  But here's the diagnosis code: L23.09 (allergic contact dermatitis) ? CPT code for patch testing is 95044 per allergen and there's 36 allergen on the TRUE patch test.   Thank you.

## 2019-12-14 NOTE — Telephone Encounter (Signed)
Patient called stating his insurance will require a pre cert before he can do the patch testing.   Insurance Company: [employer] [id number] CPT Code: 11031  Please Advise.

## 2019-12-14 NOTE — Telephone Encounter (Signed)
Candice can you assist with this matter.

## 2019-12-14 NOTE — Telephone Encounter (Signed)
Dr. Selena Batten can you advise on this? We have not ever had to do one for this that I know of.

## 2019-12-18 NOTE — Telephone Encounter (Signed)
There wouldn't be anything I could do on my end. If he needs a pre cert auth it would need to be filed with his insurance. I have never done anything like this before and never heard of [employer] needing this.

## 2019-12-18 NOTE — Telephone Encounter (Signed)
There wouldn't be anything I could do on my end. If he needs a pre cert auth it would need to be filed with his insurance. I have never done anything like this before and never heard of [employer] needing this.  

## 2019-12-18 NOTE — Telephone Encounter (Signed)
Can someone call his insurance and clarify? [phone]  Thank you.

## 2019-12-19 NOTE — Telephone Encounter (Signed)
Spoke to patients wife and it was a misunderstanding, they were under the impression that WE needed pre-certification before we can do the patch testing. I told the wife to just have him give [name] a call to schedule an appointment to have his patch testing done.

## 2020-01-01 ENCOUNTER — Encounter: Payer: Self-pay | Admitting: Family

## 2020-01-01 ENCOUNTER — Ambulatory Visit (INDEPENDENT_AMBULATORY_CARE_PROVIDER_SITE_OTHER): Payer: BC Managed Care – PPO | Admitting: Family

## 2020-01-01 ENCOUNTER — Other Ambulatory Visit: Payer: Self-pay

## 2020-01-01 DIAGNOSIS — L259 Unspecified contact dermatitis, unspecified cause: Secondary | ICD-10-CM

## 2020-01-01 NOTE — Progress Notes (Signed)
Follow-up Note  RE: [name] MRN: [id number] DOB: 04-20-1980 Date of Office Visit: 01/01/2020  Primary care provider: [place], Betty G, MD Referring provider: [place], Betty G, MD   Chaddrick returns to the office today for the patch test placement, given suspected history of contact dermatitis.    Diagnostics: True Test patches placed.    Plan:   Allergic contact dermatitis - Instructions provided on care of the patches for the next 48 hours. - [name] was instructed to avoid showering for the next 48 hours. - [name] will follow up in 48 hours and 96 hours for patch readings.  Thank you for the opportunity to care for Dave Luna. Please do not hesitate to contact us with any questions.  Nehemiah Settle, FNP [facility]

## 2020-01-01 NOTE — Progress Notes (Deleted)
Follow Up Note  RE: Dave Luna MRN: [id number] DOB: 21-Nov-1980 Date of Office Visit: 01/01/2020  Referring provider: [place], Betty G, MD Primary care provider: [place], Betty G, MD  History of Present Illness: I had the pleasure of seeing Dave Luna for a follow up visit at the [facility] on 01/01/2020. He is a 39 y.o. male, who is being followed for dermatitis, reactive airway disease and allergic rhinoconjunctivitis. Today he is here for patch test placement, given suspected history of contact dermatitis.    Reactive airway disease Past history - Shortness of breath and wheezing for the past 1 year mainly after spending significant time outdoors. Symptoms resolve the next day if takes additional antihistamine. No previous diagnosis of asthma but had albuterol as a child but stopped due to palpitations. 2020 spirometry showed: No overt abnormalities noted with no significant improvement in FEV1 post bronchodilator treatment. Trigger: humidity. Interim history - asymptomatic with no albuterol use.   May use levoalbuterol rescue inhaler 2 puffs as needed for shortness of breath, chest tightness, coughing, and wheezing. May use levoalbuterol rescue inhaler 2 puffs 5 to 15 minutes prior to strenuous physical activities. Monitor frequency of use.  If using it more than 2 times a week let us knowand may need daily ICS controller inhaler during certain seasons.  Seasonal and perennial allergic rhinoconjunctivitis Past history - Worsening rhino conjunctivitis symptoms from spring through fall for the last few years. Skin testing 7 years ago was positive to trees, weed, grass, dogs, cats per patient report. No previous allergy immunotherapy. 2020 skin testing showed: Positive to grass, weed, trees, mold, dust mites. Interim history - asymptomatic with no medications.   Continue environmental control measures.  May use over the counter antihistamines such as Zyrtec  (cetirizine), Claritin (loratadine), Allegra (fexofenadine), or Xyzal (levocetirizine) daily as needed. ? Take daily antihistamine starting in the spring (end of February) and may take it twice a day if needed.   May use mometasone nasal spray 1 spray per nostril 1-2 times a day as needed for nasal congestion. ? Take daily during the spring.   Nasal saline spray (i.e., Simply Saline) or nasal saline lavage (i.e., NeilMed) is recommended as needed and prior to medicated nasal sprays.  May use Pazeo 1 drop in each eye daily as needed for itchy/watery eyes.   Start Singulair (montelukast) 10mg  daily at night during the spring.  Dermatitis Saw dermatology and prescribed a stronger topical cream which helped but seems to be thinning his skin. Desonide helps as long as he was using it.  Use desonide cream twice a day on the rash. Avoid the eyeball. Do not use more than 7 days in a row.  Take pictures if it flares.  See below for proper skin care.  Recommend patch testing next.   Patches are best placed on Monday with return to office on Wednesday and Friday of same week for readings. Patches once placed should not get wet. You do not have to stop any medications for patch testing but should not be on oral prednisone. You can schedule a patch testing visit when convenient for your schedule.   Call your insurance about patch testing costs: ? CPT code for patch testing is 95044 per allergen and there's 36 allergen on our patch testing.   Return for Patch testing. Diagnostics: TRUE Test patches placed.   Assessment and Plan: Dave Luna is a 39 y.o. male with: No problem-specific Assessment & Plan notes found for  this encounter.   The patient was instructed regarding proper care of the patches for the next 48 hours. Do not get patches wet - avoid showering until the next visit. Do not engage in vigorous physical activity.  Patient will follow up in 48 hours and 96 hours for patch  readings.  It was my pleasure to see Dave Luna today and participate in his care. Please feel free to contact me with any questions or concerns.  Sincerely,  Wyline Mood, DO Allergy & Immunology  [facility] office: [phone] [facility] office: [phone] [place] office: [phone]

## 2020-01-03 ENCOUNTER — Other Ambulatory Visit: Payer: Self-pay

## 2020-01-03 ENCOUNTER — Encounter: Payer: Self-pay | Admitting: Allergy

## 2020-01-03 ENCOUNTER — Ambulatory Visit: Payer: BC Managed Care – PPO | Admitting: Allergy

## 2020-01-03 DIAGNOSIS — L259 Unspecified contact dermatitis, unspecified cause: Secondary | ICD-10-CM

## 2020-01-03 DIAGNOSIS — L309 Dermatitis, unspecified: Secondary | ICD-10-CM

## 2020-01-03 NOTE — Progress Notes (Signed)
   Follow Up Note  RE: Dave Luna MRN: [id number] DOB: 1980/10/15 Date of Office Visit: 01/03/2020  Referring provider: [place], Betty G, MD Primary care provider: [place], Betty G, MD  History of Present Illness: I had the pleasure of seeing Dave Luna for a follow up visit at the [facility] on 01/03/2020. He is a 39 y.o. male, who is being followed for reactive airway disease, allergic rhinoconjunctivitis and dermatitis. Today he is here for initial patch test interpretation, given suspected history of contact dermatitis.   Diagnostics:  TRUE TEST 48 hour reading:   T.R.U.E. Test - 01/03/20 1300    Time Antigen Placed 1147    Manufacturer Other   Smart Practice   Location Back    Number of Test 36    Reading Interval Day 1;Day 3;Day 5    Panel Panel 1;Panel 2;Panel 3    1. Nickel Sulfate 0    2. Wool Alcohols 0    3. Neomycin Sulfate 0    4. Potassium Dichromate 0    5. Caine Mix 0    6. Fragrance Mix 0    7. Colophony 0    8. Paraben Mix 0    9. Negative Control 0    10. Balsam of [place] 0    11. Ethylenediamine Dihydrochloride 0    12. Cobalt Dichloride 0    13. p-tert Butylphenol Formaldehyde Resin 0    14. Epoxy Resin 0    15. Carba Mix 0    16.  Black Rubber Mix 0    17. Cl+ Me-Isothiazolinone 0    18. Quaternium-15 0    19. Methyldibromo Glutaronitrile 0    20. p-Phenylenediamine 0    21. Formaldehyde 0    22. Mercapto Mix 0    23. Thimerosal 0    24. Thiuram Mix 0    25. Diazolidinyl Urea 0    26. Quinoline Mix 0    27. Tixocortol-21-Pivalate 0    28. Gold Sodium Thiosulfate 0    29. Imidazolidinyl Urea 0    30. Budesonide 0    31. Hydrocortisone-17-Butyrate 0    32. Mercaptobenzothiazole 0    33. Bacitracin 0    34. Parthenolide 0    35. Disperse Blue 106 0    36. 2-Bromo-2-Nitropropane-1,3-diol 0          Assessment and Plan: Dave Luna is a 39 y.o. male with: Dermatitis  TRUE patches removed. 48 hour reading was all negative.    Return in about 2 days (around 01/05/2020) for Patch reading.  It was my pleasure to see Dave Luna today and participate in his care. Please feel free to contact me with any questions or concerns.  Sincerely,  Wyline Mood, DO Allergy & Immunology  [facility] office: [phone] [facility] office: [phone] [place] office: [phone]

## 2020-01-03 NOTE — Assessment & Plan Note (Signed)
   TRUE patches removed.  48 hour reading was all negative. 

## 2020-01-05 ENCOUNTER — Other Ambulatory Visit: Payer: Self-pay

## 2020-01-05 ENCOUNTER — Encounter: Payer: Self-pay | Admitting: Family

## 2020-01-05 ENCOUNTER — Ambulatory Visit: Payer: BC Managed Care – PPO | Admitting: Family

## 2020-01-05 DIAGNOSIS — L259 Unspecified contact dermatitis, unspecified cause: Secondary | ICD-10-CM

## 2020-01-05 NOTE — Progress Notes (Signed)
[name] returns to the office today for the final patch test interpretation, given suspected history of contact dermatitis.    Diagnostics:   TRUE TEST 96 hour read: all negative  Plan:   Allergic contact dermatitis - Continue plan as per Dr. Selena Batten Schedule follow up appointment to discuss with Dr. Selena Batten   Thank you for the opportunity to care for Dave Luna. Do not hesitate to contact us with any problems.  Nehemiah Settle, FNP [facility]

## 2020-01-15 ENCOUNTER — Encounter: Payer: Self-pay | Admitting: Allergy

## 2020-01-15 ENCOUNTER — Ambulatory Visit (INDEPENDENT_AMBULATORY_CARE_PROVIDER_SITE_OTHER): Payer: BC Managed Care – PPO | Admitting: Allergy

## 2020-01-15 ENCOUNTER — Other Ambulatory Visit: Payer: Self-pay

## 2020-01-15 VITALS — BP 122/76 | HR 71 | Temp 98.0°F | Resp 17

## 2020-01-15 DIAGNOSIS — L309 Dermatitis, unspecified: Secondary | ICD-10-CM

## 2020-01-15 DIAGNOSIS — J4520 Mild intermittent asthma, uncomplicated: Secondary | ICD-10-CM

## 2020-01-15 DIAGNOSIS — J302 Other seasonal allergic rhinitis: Secondary | ICD-10-CM | POA: Diagnosis not present

## 2020-01-15 DIAGNOSIS — J3089 Other allergic rhinitis: Secondary | ICD-10-CM

## 2020-01-15 DIAGNOSIS — H1010 Acute atopic conjunctivitis, unspecified eye: Secondary | ICD-10-CM

## 2020-01-15 NOTE — Patient Instructions (Addendum)
Rash  May use desonide 0.05% ointment twice a day as needed for mild eczema flares - okay to use on the face, neck, groin area. Do not use more than 1 week at a time.  Take pictures if it flares.  See below for proper skin care.  Avoid the hairgel for now.   Reactive airway disease  May use levoalbuterol rescue inhaler 2 puffs as needed for shortness of breath, chest tightness, coughing, and wheezing. May use levoalbuterol rescue inhaler 2 puffs 5 to 15 minutes prior to strenuous physical activities. Monitor frequency of use.   If using it more than 2 times a week let us know and may need daily ICS controller inhaler during certain seasons.   Allergic rhino conjunctivitis  Past skin testing showed: Positive to grass, weed, trees, mold, dust mites.  Continue environmental control measures.  May use over the counter antihistamines such as Zyrtec (cetirizine), Claritin (loratadine), Allegra (fexofenadine), or Xyzal (levocetirizine) daily as needed. ? Take daily antihistamine starting in the spring (end of February) and may take it twice a day if needed.   May use mometasone nasal spray 1 spray per nostril 1-2 times a day as needed for nasal congestion. ? Take daily during the spring.   Nasal saline spray (i.e., Simply Saline) or nasal saline lavage (i.e., NeilMed) is recommended as needed and prior to medicated nasal sprays.  May use Pazeo 1 drop in each eye daily as needed for itchy/watery eyes.   Start Singulair (montelukast) 10mg  daily at night during the spring.  Follow up in 3 months or sooner if needed.   Reducing Pollen Exposure . Pollen seasons: trees (spring), grass (summer) and ragweed/weeds (fall). 06-04-1979 Keep windows closed in your home and car to lower pollen exposure.  Marland Kitchen air conditioning in the bedroom and throughout the house if possible.  . Avoid going out in dry windy days - especially early morning. . Pollen counts are highest between 5 - 10 AM and on dry,  hot and windy days.  . Save outside activities for late afternoon or after a heavy rain, when pollen levels are lower.  . Avoid mowing of grass if you have grass pollen allergy. Lilian Kapur Be aware that pollen can also be transported indoors on people and pets.  . Dry your clothes in an automatic dryer rather than hanging them outside where they might collect pollen.  . Rinse hair and eyes before bedtime.  Skin care recommendations  Bath time: . Always use lukewarm water. AVOID very hot or cold water. Marland Kitchen Keep bathing time to 5-10 minutes. . Do NOT use bubble bath. . Use a mild soap and use just enough to wash the dirty areas. . Do NOT scrub skin vigorously.  . After bathing, pat dry your skin with a towel. Do NOT rub or scrub the skin.  Moisturizers and prescriptions:  . ALWAYS apply moisturizers immediately after bathing (within 3 minutes). This helps to lock-in moisture. . Use the moisturizer several times a day over the whole body. Marland Kitchen summer moisturizers include: Aveeno, CeraVe, Cetaphil. Peri Jefferson winter moisturizers include: Aquaphor, Vaseline, Cerave, Cetaphil, Eucerin, Vanicream. . When using moisturizers along with medications, the moisturizer should be applied about one hour after applying the medication to prevent diluting effect of the medication or moisturize around where you applied the medications. When not using medications, the moisturizer can be continued twice daily as maintenance.  Laundry and clothing: . Avoid laundry products with added color or perfumes. Peri Jefferson Use  unscented hypo-allergenic laundry products such as Tide free, Cheer free & gentle, and All free and clear.  . If the skin still seems dry or sensitive, you can try double-rinsing the clothes. . Avoid tight or scratchy clothing such as wool. . Do not use fabric softeners or dyer sheets.

## 2020-01-15 NOTE — Assessment & Plan Note (Signed)
Rash improved on the face. Now has some rash on left hand. TRUE patch test all negative.  May use desonide 0.05% ointment twice a day as needed for mild eczema flares - okay to use on the face, neck, groin area. Do not use more than 1 week at a time.  Take pictures if it flares.  See below for proper skin care.  Avoid the hairgel for now.   If rash flares again, will refer back to dermatology for a more comprehensive patch testing and/or skin biopsy.

## 2020-01-15 NOTE — Assessment & Plan Note (Signed)
Past history - Worsening rhino conjunctivitis symptoms from spring through fall for the last few years. Skin testing 7 years ago was positive to trees, weed, grass, dogs, cats per patient report. No previous allergy immunotherapy. 2020 skin testing showed: Positive to grass, weed, trees, mold, dust mites. Interim history - asymptomatic with no medications as no pollen outside.  Continue environmental control measures.  May use over the counter antihistamines such as Zyrtec (cetirizine), Claritin (loratadine), Allegra (fexofenadine), or Xyzal (levocetirizine) daily as needed. ? Take daily antihistamine starting in the spring (end of February) and may take it twice a day if needed.   May use mometasone nasal spray 1 spray per nostril 1-2 times a day as needed for nasal congestion. ? Take daily during the spring.   Nasal saline spray (i.e., Simply Saline) or nasal saline lavage (i.e., NeilMed) is recommended as needed and prior to medicated nasal sprays.  May use Pazeo 1 drop in each eye daily as needed for itchy/watery eyes.   Start Singulair (montelukast) 10mg  daily at night during the spring.

## 2020-01-15 NOTE — Assessment & Plan Note (Addendum)
Past history - Shortness of breath and wheezing for the past 1 year mainly after spending significant time outdoors. Symptoms resolve the next day if takes additional antihistamine. No previous diagnosis of asthma but had albuterol as a child but stopped due to palpitations. 2020 spirometry showed: No overt abnormalities noted with no significant improvement in FEV1 post bronchodilator treatment. Trigger: humidity. Interim history - asymptomatic with no levoalbuterol use.   May use levoalbuterol rescue inhaler 2 puffs as needed for shortness of breath, chest tightness, coughing, and wheezing. May use levoalbuterol rescue inhaler 2 puffs 5 to 15 minutes prior to strenuous physical activities. Monitor frequency of use.   If using it more than 2 times a week let us know and may need daily ICS controller inhaler during certain seasons.

## 2020-01-15 NOTE — Progress Notes (Signed)
Follow Up Note  RE: Dave Luna MRN: [id number] DOB: September 08, 1980 Date of Office Visit: 01/15/2020  Referring provider: [place], Betty G, MD Primary care provider: [place], Betty G, MD  Chief Complaint: Eczema  History of Present Illness: I had the pleasure of seeing Dave Luna for a follow up visit at the [facility] on 01/15/2020. He is a 39 y.o. male, who is being followed for dermatitis, allergic rhinoconjunctivitis and reactive airway disease. His previous allergy office visit was on 01/05/2020 with Dr. Selena Luna. Today is a regular follow up visit.  Reactive airway disease Denies any SOB, coughing, wheezing, chest tightness, nocturnal awakenings, ER/urgent care visits or prednisone use since the last visit.  Seasonal and perennial allergic rhinoconjunctivitis Not using any medications currently and asymptomatic.   Dermatitis True patch test was negative. Patient started using glass ware and no Yeti mugs for the past week and noticed some improvement.    Currently using non-scented laundry detergent, body wash, shampoo x 4 weeks.  Using desonide as needed with good benefit.  Using hairgel for the past 4 weeks with no worsening symptoms. Does have some rash on left hand though.  No dermatology visit since last visit.  Assessment and Plan: Dave Luna is a 39 y.o. male with: Dermatitis Rash improved on the face. Now has some rash on left hand. TRUE patch test all negative.  May use desonide 0.05% ointment twice a day as needed for mild eczema flares - okay to use on the face, neck, groin area. Do not use more than 1 week at a time.  Take pictures if it flares.  See below for proper skin care.  Avoid the hairgel for now.   If rash flares again, will refer back to dermatology for a more comprehensive patch testing and/or skin biopsy.   Reactive airway disease Past history - Shortness of breath and wheezing for the past 1 year mainly after spending significant time  outdoors. Symptoms resolve the next day if takes additional antihistamine. No previous diagnosis of asthma but had albuterol as a child but stopped due to palpitations. 2020 spirometry showed: No overt abnormalities noted with no significant improvement in FEV1 post bronchodilator treatment. Trigger: humidity. Interim history - asymptomatic with no levoalbuterol use.   May use levoalbuterol rescue inhaler 2 puffs as needed for shortness of breath, chest tightness, coughing, and wheezing. May use levoalbuterol rescue inhaler 2 puffs 5 to 15 minutes prior to strenuous physical activities. Monitor frequency of use.   If using it more than 2 times a week let us know and may need daily ICS controller inhaler during certain seasons.   Seasonal and perennial allergic rhinoconjunctivitis Past history - Worsening rhino conjunctivitis symptoms from spring through fall for the last few years. Skin testing 7 years ago was positive to trees, weed, grass, dogs, cats per patient report. No previous allergy immunotherapy. 2020 skin testing showed: Positive to grass, weed, trees, mold, dust mites. Interim history - asymptomatic with no medications as no pollen outside.  Continue environmental control measures.  May use over the counter antihistamines such as Zyrtec (cetirizine), Claritin (loratadine), Allegra (fexofenadine), or Xyzal (levocetirizine) daily as needed. ? Take daily antihistamine starting in the spring (end of February) and may take it twice a day if needed.   May use mometasone nasal spray 1 spray per nostril 1-2 times a day as needed for nasal congestion. ? Take daily during the spring.   Nasal saline spray (i.e., Simply Saline) or nasal  saline lavage (i.e., NeilMed) is recommended as needed and prior to medicated nasal sprays.  May use Pazeo 1 drop in each eye daily as needed for itchy/watery eyes.   Start Singulair (montelukast) 10mg  daily at night during the spring.  Return in about 3  months (around 04/14/2020).  Diagnostics: None.  Medication List:  Current Outpatient Medications  Medication Sig Dispense Refill  . desonide (DESOWEN) 0.05 % cream Apply topically 2 (two) times daily as needed. Careful to avoid the eyeball. Do not use more than 7 days in a row. 30 Luna 0  . fexofenadine (ALLEGRA) 180 MG tablet Take 180 mg by mouth daily as needed for allergies or rhinitis.    04/16/2020 levalbuterol (XOPENEX HFA) 45 MCG/ACT inhaler TAKE 2 PUFFS BY MOUTH EVERY 6 HOURS AS NEEDED FOR WHEEZE OR SHORTNESS OF BREATH 15 Luna 2  . mometasone (NASONEX) 50 MCG/ACT nasal spray Place 2 sprays into the nose daily. 17 Luna 6  . montelukast (SINGULAIR) 10 MG tablet Take 1 tablet (10 mg total) by mouth at bedtime. 30 tablet 5  . Olopatadine HCl 0.2 % SOLN Apply 1 drop to eye daily as needed (itchy/watery eyes). 2.5 mL 5   No current facility-administered medications for this visit.   Allergies: Allergies  Allergen Reactions  . Ciprofloxacin Palpitations   I reviewed his past medical history, social history, family history, and environmental history and no significant changes have been reported from his previous visit.  Review of Systems  Constitutional: Negative for appetite change, chills, fever and unexpected weight change.  HENT: Negative for congestion, rhinorrhea and sneezing.   Eyes: Negative for itching.  Respiratory: Negative for cough, chest tightness, shortness of breath and wheezing.   Cardiovascular: Negative for chest pain.  Gastrointestinal: Negative for abdominal pain.  Genitourinary: Negative for difficulty urinating.  Skin: Positive for rash.  Allergic/Immunologic: Positive for environmental allergies. Negative for food allergies.  Neurological: Negative for headaches.   Objective: BP 122/76   Pulse 71   Temp 98 F (36.7 C) (Temporal)   Resp 17   SpO2 99%  There is no height or weight on file to calculate BMI. Physical Exam Vitals and nursing note reviewed.   Constitutional:      Appearance: Normal appearance. He is well-developed.  HENT:     Head: Normocephalic and atraumatic.     Right Ear: Tympanic membrane and external ear normal.     Left Ear: Tympanic membrane and external ear normal.     Nose: Nose normal.     Mouth/Throat:     Mouth: Mucous membranes are moist.     Pharynx: Oropharynx is clear.  Eyes:     Conjunctiva/sclera: Conjunctivae normal.  Cardiovascular:     Rate and Rhythm: Normal rate and regular rhythm.     Heart sounds: Normal heart sounds. No murmur heard. No friction rub. No gallop.   Pulmonary:     Effort: Pulmonary effort is normal.     Breath sounds: Normal breath sounds. No wheezing, rhonchi or rales.  Musculoskeletal:     Cervical back: Neck supple.  Skin:    General: Skin is warm.     Findings: Rash present.     Comments: Few patches of dry erythematous rash on left palm and forearm area.   Neurological:     Mental Status: He is alert and oriented to person, place, and time.  Psychiatric:        Behavior: Behavior normal.    Previous notes and tests were  reviewed. The plan was reviewed with the patient/family, and all questions/concerned were addressed.  It was my pleasure to see Syon today and participate in his care. Please feel free to contact me with any questions or concerns.  Sincerely,  Wyline Mood, DO Allergy & Immunology  [facility] office: [phone] [facility] office: [phone]

## 2020-03-08 DIAGNOSIS — L249 Irritant contact dermatitis, unspecified cause: Secondary | ICD-10-CM | POA: Diagnosis not present

## 2020-03-28 DIAGNOSIS — L249 Irritant contact dermatitis, unspecified cause: Secondary | ICD-10-CM | POA: Diagnosis not present

## 2020-04-10 NOTE — Progress Notes (Signed)
Follow Up Note  RE: Dave Luna MRN: [id number] DOB: [birth year] Date of Office Visit: 04/11/2020  Referring provider: [place], Betty G, MD Primary care provider: [place], Betty G, MD  Chief Complaint: Allergic Reaction (Doing very good, not as severe as before, taking all medications as prescribed )  History of Present Illness: I had the pleasure of seeing Dave Luna for a follow up visit at the [facility] on 04/11/2020. He is a 40 y.o. male, who is being followed for dermatitis, allergic rhinoconjunctivitis and reactive airway disease. His previous allergy office visit was on 01/15/2020 with Dr. Selena Batten. Today is a regular follow up visit.  Dermatitis Stopped using hand sanitizer and the hands are doing much better.  Using Eucerin for moisturizer with good benefit.  Change personal care products to fragrance free and dye free products. No more issues on the face.   Reactive airway disease Denies any SOB, coughing, wheezing, chest tightness, nocturnal awakenings, ER/urgent care visits or prednisone use since the last visit. Usually flares with humidity.   Seasonal and perennial allergic rhinoconjunctivitis Currently taking allegra in the morning and Singulair at night with good benefit.  Not using any nasal sprays or eye drops yet.  Taking showers at night as well.   Assessment and Plan: English is a 40 y.o. male with: Reactive airway disease Past history - Shortness of breath and wheezing for the past 1 year mainly after spending significant time outdoors. Symptoms resolve the next day if takes additional antihistamine. No previous diagnosis of asthma but had albuterol as a child but stopped due to palpitations. 2020 spirometry showed: No overt abnormalities noted with no significant improvement in FEV1 post bronchodilator treatment. Trigger: humidity. Interim history - asymptomatic and no inhaler use.   May use levoalbuterol rescue inhaler 2 puffs as needed for  shortness of breath, chest tightness, coughing, and wheezing. May use levoalbuterol rescue inhaler 2 puffs 5 to 15 minutes prior to strenuous physical activities. Monitor frequency of use.   If using it more than 2 times a week let us know and may need daily ICS controller inhaler during certain seasons.   Seasonal and perennial allergic rhinoconjunctivitis Past history - Worsening rhino conjunctivitis symptoms from spring through fall for the last few years. Skin testing 7 years ago was positive to trees, weed, grass, dogs, cats per patient report. No previous allergy immunotherapy. 2020 skin testing showed: Positive to grass, weed, trees, mold, dust mites. Interim history - started on Allegra and Singulair and having minimal symptoms.   Continue environmental control measures.  May use over the counter antihistamines such as Zyrtec (cetirizine), Claritin (loratadine), Allegra (fexofenadine), or Xyzal (levocetirizine) daily as needed. ? Take daily antihistamine starting in the spring (end of February) and may take it twice a day if needed.   Take Singulair (montelukast) 10mg  daily at night during the spring and summer.   May use mometasone nasal spray 1 spray per nostril 1-2 times a day as needed for nasal congestion.  Nasal saline spray (i.e., Simply Saline) or nasal saline lavage (i.e., NeilMed) is recommended as needed and prior to medicated nasal sprays.  May use Pazeo 1 drop in each eye daily as needed for itchy/watery eyes.   Dermatitis Past history - TRUE patch test all negative. Interim history - some patches on the hands. Much better with avoiding hand sanitizers and only using fragrance free and dye free products.  May use desonide 0.05% ointment twice a day as needed for  mild eczema flares - okay to use on the face, neck, groin area. Do not use more than 1 week at a time.  Take pictures if it flares.  Continue proper skin care.  Return in about 4 months (around  08/11/2020).  No orders of the defined types were placed in this encounter.  Lab Orders  No laboratory test(s) ordered today    Diagnostics: None.   Medication List:  Current Outpatient Medications  Medication Sig Dispense Refill  . betamethasone dipropionate 0.05 % cream APPLY TO HANDS TWICE DAILY X 2-3 WEEKS, THEN AS NEEDED FOR FLARING/ITCHING.    . EUCRISA 2 % OINT Apply 1 application topically 2 (two) times daily.    . fexofenadine (ALLEGRA) 180 MG tablet Take 180 mg by mouth daily as needed for allergies or rhinitis.    Marland Kitchen levalbuterol (XOPENEX HFA) 45 MCG/ACT inhaler TAKE 2 PUFFS BY MOUTH EVERY 6 HOURS AS NEEDED FOR WHEEZE OR SHORTNESS OF BREATH 15 g 2  . montelukast (SINGULAIR) 10 MG tablet Take 1 tablet (10 mg total) by mouth at bedtime. 30 tablet 5  . Olopatadine HCl 0.2 % SOLN Apply 1 drop to eye daily as needed (itchy/watery eyes). 2.5 mL 5   No current facility-administered medications for this visit.   Allergies: Allergies  Allergen Reactions  . Ciprofloxacin Palpitations   I reviewed his past medical history, social history, family history, and environmental history and no significant changes have been reported from his previous visit.  Review of Systems  Constitutional: Negative for appetite change, chills, fever and unexpected weight change.  HENT: Negative for congestion, rhinorrhea and sneezing.   Eyes: Negative for itching.  Respiratory: Negative for cough, chest tightness, shortness of breath and wheezing.   Cardiovascular: Negative for chest pain.  Gastrointestinal: Negative for abdominal pain.  Genitourinary: Negative for difficulty urinating.  Skin: Positive for rash.  Allergic/Immunologic: Positive for environmental allergies. Negative for food allergies.  Neurological: Negative for headaches.   Objective: BP 126/74 (BP Location: Left Arm, Patient Position: Sitting, Cuff Size: Normal)   Pulse 80   Temp 98.3 F (36.8 C) (Temporal)   Resp 16   Ht 5'  8.5" (1.74 m)   Wt 199 lb 12 oz (90.6 kg)   SpO2 97%   BMI 29.93 kg/m  Body mass index is 29.93 kg/m. Physical Exam Vitals and nursing note reviewed.  Constitutional:      Appearance: Normal appearance. He is well-developed.  HENT:     Head: Normocephalic and atraumatic.     Right Ear: Tympanic membrane and external ear normal.     Left Ear: Tympanic membrane and external ear normal.     Nose: Nose normal.     Mouth/Throat:     Mouth: Mucous membranes are moist.     Pharynx: Oropharynx is clear.  Eyes:     Conjunctiva/sclera: Conjunctivae normal.  Cardiovascular:     Rate and Rhythm: Normal rate and regular rhythm.     Heart sounds: Normal heart sounds. No murmur heard. No friction rub. No gallop.   Pulmonary:     Effort: Pulmonary effort is normal.     Breath sounds: Normal breath sounds. No wheezing, rhonchi or rales.  Musculoskeletal:     Cervical back: Neck supple.  Skin:    General: Skin is warm.     Findings: Rash present.     Comments: Few erythematous patches on the webs of his fingers b/l.  Neurological:     Mental Status: He is alert  and oriented to person, place, and time.  Psychiatric:        Behavior: Behavior normal.    Previous notes and tests were reviewed. The plan was reviewed with the patient/family, and all questions/concerned were addressed.  It was my pleasure to see Dave Luna today and participate in his care. Please feel free to contact me with any questions or concerns.  Sincerely,  Wyline Mood, DO Allergy & Immunology  [facility] office: [phone] [facility] office: [phone]

## 2020-04-11 ENCOUNTER — Ambulatory Visit (INDEPENDENT_AMBULATORY_CARE_PROVIDER_SITE_OTHER): Payer: BC Managed Care – PPO | Admitting: Allergy

## 2020-04-11 ENCOUNTER — Other Ambulatory Visit: Payer: Self-pay

## 2020-04-11 ENCOUNTER — Encounter: Payer: Self-pay | Admitting: Allergy

## 2020-04-11 VITALS — BP 126/74 | HR 80 | Temp 98.3°F | Resp 16 | Ht 68.5 in | Wt 199.8 lb

## 2020-04-11 DIAGNOSIS — J3089 Other allergic rhinitis: Secondary | ICD-10-CM

## 2020-04-11 DIAGNOSIS — H1013 Acute atopic conjunctivitis, bilateral: Secondary | ICD-10-CM | POA: Diagnosis not present

## 2020-04-11 DIAGNOSIS — J4520 Mild intermittent asthma, uncomplicated: Secondary | ICD-10-CM | POA: Diagnosis not present

## 2020-04-11 DIAGNOSIS — L309 Dermatitis, unspecified: Secondary | ICD-10-CM

## 2020-04-11 DIAGNOSIS — J302 Other seasonal allergic rhinitis: Secondary | ICD-10-CM | POA: Diagnosis not present

## 2020-04-11 DIAGNOSIS — H1010 Acute atopic conjunctivitis, unspecified eye: Secondary | ICD-10-CM

## 2020-04-11 NOTE — Assessment & Plan Note (Signed)
Past history - Worsening rhino conjunctivitis symptoms from spring through fall for the last few years. Skin testing 7 years ago was positive to trees, weed, grass, dogs, cats per patient report. No previous allergy immunotherapy. 2020 skin testing showed: Positive to grass, weed, trees, mold, dust mites. Interim history - started on Allegra and Singulair and having minimal symptoms.   Continue environmental control measures.  May use over the counter antihistamines such as Zyrtec (cetirizine), Claritin (loratadine), Allegra (fexofenadine), or Xyzal (levocetirizine) daily as needed. ? Take daily antihistamine starting in the spring (end of February) and may take it twice a day if needed.   Take Singulair (montelukast) 10mg  daily at night during the spring and summer.   May use mometasone nasal spray 1 spray per nostril 1-2 times a day as needed for nasal congestion.  Nasal saline spray (i.e., Simply Saline) or nasal saline lavage (i.e., NeilMed) is recommended as needed and prior to medicated nasal sprays.  May use Pazeo 1 drop in each eye daily as needed for itchy/watery eyes.

## 2020-04-11 NOTE — Patient Instructions (Addendum)
Rash  May use desonide 0.05% ointment twice a day as needed for mild eczema flares - okay to use on the face, neck, groin area. Do not use more than 1 week at a time.  Take pictures if it flares.  Continue proper skin care.  Reactive airway disease  May use levoalbuterol rescue inhaler 2 puffs as needed for shortness of breath, chest tightness, coughing, and wheezing. May use levoalbuterol rescue inhaler 2 puffs 5 to 15 minutes prior to strenuous physical activities. Monitor frequency of use.   If using it more than 2 times a week let us know and may need daily ICS controller inhaler during certain seasons.   Allergic rhino conjunctivitis  Past skin testing showed: Positive to grass, weed, trees, mold, dust mites.  Continue environmental control measures.  May use over the counter antihistamines such as Zyrtec (cetirizine), Claritin (loratadine), Allegra (fexofenadine), or Xyzal (levocetirizine) daily as needed. ? Take daily antihistamine starting in the spring (end of February) and may take it twice a day if needed.   Start Singulair (montelukast) 10mg  daily at night during the spring.  May use mometasone nasal spray 1 spray per nostril 1-2 times a day as needed for nasal congestion.  Nasal saline spray (i.e., Simply Saline) or nasal saline lavage (i.e., NeilMed) is recommended as needed and prior to medicated nasal sprays.  May use Pazeo 1 drop in each eye daily as needed for itchy/watery eyes.   Follow up in 4 months or sooner if needed.   Reducing Pollen Exposure . Pollen seasons: trees (spring), grass (summer) and ragweed/weeds (fall). 06-04-1979 Keep windows closed in your home and car to lower pollen exposure.  Marland Kitchen air conditioning in the bedroom and throughout the house if possible.  . Avoid going out in dry windy days - especially early morning. . Pollen counts are highest between 5 - 10 AM and on dry, hot and windy days.  . Save outside activities for late afternoon or  after a heavy rain, when pollen levels are lower.  . Avoid mowing of grass if you have grass pollen allergy. Lilian Kapur Be aware that pollen can also be transported indoors on people and pets.  . Dry your clothes in an automatic dryer rather than hanging them outside where they might collect pollen.  . Rinse hair and eyes before bedtime.  Skin care recommendations  Bath time: . Always use lukewarm water. AVOID very hot or cold water. Marland Kitchen Keep bathing time to 5-10 minutes. . Do NOT use bubble bath. . Use a mild soap and use just enough to wash the dirty areas. . Do NOT scrub skin vigorously.  . After bathing, pat dry your skin with a towel. Do NOT rub or scrub the skin.  Moisturizers and prescriptions:  . ALWAYS apply moisturizers immediately after bathing (within 3 minutes). This helps to lock-in moisture. . Use the moisturizer several times a day over the whole body. Marland Kitchen summer moisturizers include: Aveeno, CeraVe, Cetaphil. [place] winter moisturizers include: Aquaphor, Vaseline, Cerave, Cetaphil, Eucerin, Vanicream. . When using moisturizers along with medications, the moisturizer should be applied about one hour after applying the medication to prevent diluting effect of the medication or moisturize around where you applied the medications. When not using medications, the moisturizer can be continued twice daily as maintenance.  Laundry and clothing: . Avoid laundry products with added color or perfumes. . Use unscented hypo-allergenic laundry products such as Tide free, Cheer free & gentle, and All free and  clear.  . If the skin still seems dry or sensitive, you can try double-rinsing the clothes. . Avoid tight or scratchy clothing such as wool. . Do not use fabric softeners or dyer sheets.

## 2020-04-11 NOTE — Assessment & Plan Note (Signed)
Past history - Shortness of breath and wheezing for the past 1 year mainly after spending significant time outdoors. Symptoms resolve the next day if takes additional antihistamine. No previous diagnosis of asthma but had albuterol as a child but stopped due to palpitations. 2020 spirometry showed: No overt abnormalities noted with no significant improvement in FEV1 post bronchodilator treatment. Trigger: humidity. Interim history - asymptomatic and no inhaler use.   May use levoalbuterol rescue inhaler 2 puffs as needed for shortness of breath, chest tightness, coughing, and wheezing. May use levoalbuterol rescue inhaler 2 puffs 5 to 15 minutes prior to strenuous physical activities. Monitor frequency of use.   If using it more than 2 times a week let us know and may need daily ICS controller inhaler during certain seasons.

## 2020-04-11 NOTE — Assessment & Plan Note (Signed)
Past history - TRUE patch test all negative. Interim history - some patches on the hands. Much better with avoiding hand sanitizers and only using fragrance free and dye free products.  May use desonide 0.05% ointment twice a day as needed for mild eczema flares - okay to use on the face, neck, groin area. Do not use more than 1 week at a time.  Take pictures if it flares.  Continue proper skin care.

## 2020-08-13 ENCOUNTER — Other Ambulatory Visit: Payer: Self-pay

## 2020-08-13 ENCOUNTER — Ambulatory Visit (INDEPENDENT_AMBULATORY_CARE_PROVIDER_SITE_OTHER): Payer: BC Managed Care – PPO | Admitting: Allergy

## 2020-08-13 ENCOUNTER — Encounter: Payer: Self-pay | Admitting: Allergy

## 2020-08-13 VITALS — BP 102/70 | HR 68 | Temp 98.0°F | Resp 16

## 2020-08-13 DIAGNOSIS — H1010 Acute atopic conjunctivitis, unspecified eye: Secondary | ICD-10-CM

## 2020-08-13 DIAGNOSIS — L309 Dermatitis, unspecified: Secondary | ICD-10-CM | POA: Diagnosis not present

## 2020-08-13 DIAGNOSIS — H1013 Acute atopic conjunctivitis, bilateral: Secondary | ICD-10-CM

## 2020-08-13 DIAGNOSIS — J302 Other seasonal allergic rhinitis: Secondary | ICD-10-CM | POA: Diagnosis not present

## 2020-08-13 DIAGNOSIS — J4520 Mild intermittent asthma, uncomplicated: Secondary | ICD-10-CM | POA: Diagnosis not present

## 2020-08-13 DIAGNOSIS — J3089 Other allergic rhinitis: Secondary | ICD-10-CM

## 2020-08-13 NOTE — Patient Instructions (Addendum)
Rash May use desonide 0.05% ointment twice a day as needed for mild eczema flares - okay to use on the face, neck, groin area. Do not use more than 1 week at a time. May use Eucrisa (crisaborole) 2% ointment twice a day on mild rash flares - okay to use on the face. This is a non-steroid ointment.  Take pictures if it flares. Continue proper skin care.  Reactive airway disease May use levoalbuterol rescue inhaler 2 puffs as needed for shortness of breath, chest tightness, coughing, and wheezing. May use levoalbuterol rescue inhaler 2 puffs 5 to 15 minutes prior to strenuous physical activities. Monitor frequency of use.    Allergic rhino conjunctivitis Past skin testing showed: Positive to grass, weed, trees, mold, dust mites. Continue environmental control measures. May use over the counter antihistamines such as Zyrtec (cetirizine), Claritin (loratadine), Allegra (fexofenadine), or Xyzal (levocetirizine) daily as needed. May take twice a day during allergy flares. May switch antihistamines every few months. Take daily antihistamine starting in the spring (end of February). Take Singulair (montelukast) 10mg  daily at night during the spring. May use mometasone nasal spray 1 spray per nostril 1-2 times a day as needed for nasal congestion. Nasal saline spray (i.e., Simply Saline) or nasal saline lavage (i.e., NeilMed) is recommended as needed and prior to medicated nasal sprays. May use Pazeo 1 drop in each eye daily as needed for itchy/watery eyes.   Follow up in April 2023 or sooner if needed.   Reducing Pollen Exposure Pollen seasons: trees (spring), grass (summer) and ragweed/weeds (fall). Keep windows closed in your home and car to lower pollen exposure.  Install air conditioning in the bedroom and throughout the house if possible.  Avoid going out in dry windy days - especially early morning. Pollen counts are highest between 5 - 10 AM and on dry, hot and windy days.  Save outside  activities for late afternoon or after a heavy rain, when pollen levels are lower.  Avoid mowing of grass if you have grass pollen allergy. Be aware that pollen can also be transported indoors on people and pets.  Dry your clothes in an automatic dryer rather than hanging them outside where they might collect pollen.  Rinse hair and eyes before bedtime.  Skin care recommendations  Bath time: Always use lukewarm water. AVOID very hot or cold water. Keep bathing time to 5-10 minutes. Do NOT use bubble bath. Use a mild soap and use just enough to wash the dirty areas. Do NOT scrub skin vigorously.  After bathing, pat dry your skin with a towel. Do NOT rub or scrub the skin.  Moisturizers and prescriptions:  ALWAYS apply moisturizers immediately after bathing (within 3 minutes). This helps to lock-in moisture. Use the moisturizer several times a day over the whole body. Good summer moisturizers include: Aveeno, CeraVe, Cetaphil. Good winter moisturizers include: Aquaphor, Vaseline, Cerave, Cetaphil, Eucerin, Vanicream. When using moisturizers along with medications, the moisturizer should be applied about one hour after applying the medication to prevent diluting effect of the medication or moisturize around where you applied the medications. When not using medications, the moisturizer can be continued twice daily as maintenance.  Laundry and clothing: Avoid laundry products with added color or perfumes. Use unscented hypo-allergenic laundry products such as Tide free, Cheer free & gentle, and All free and clear.  If the skin still seems dry or sensitive, you can try double-rinsing the clothes. Avoid tight or scratchy clothing such as wool. Do not use fabric  softeners or dyer sheets.

## 2020-08-13 NOTE — Progress Notes (Signed)
Follow Up Note  RE: Dave Luna MRN: [id number] DOB: [birth year] Date of Office Visit: 08/13/2020  Referring provider: [place], Betty G, MD Primary care provider: [place], Betty G, MD  Chief Complaint: Allergies (Doing well)  History of Present Illness: I had the pleasure of seeing Dave Luna for a follow up visit at the [facility] on 08/13/2020. He is a 40 y.o. male, who is being followed for allergic rhinoconjunctivitis, reactive airway disease and dermatitis. His previous allergy office visit was on 04/11/2020 with Dr. Selena Batten. Today is a regular follow up visit.  Reactive airway disease Denies any SOB, coughing, wheezing, chest tightness, nocturnal awakenings, ER/urgent care visits or prednisone use since the last visit. Used albuterol once when outdoors and it was very humid with good benefit.    Seasonal and perennial allergic rhinoconjunctivitis Stopped Singulair and allegra a few weeks ago with no worsening symptoms.  Not using any nasal sprays or eye drops on a daily basis.  Dermatitis Used desonide ointment with good benefit on the eyelids.  Used Eucrisa on the hand due to recent flare after cleaning.   Assessment and Plan: Nil is a 40 y.o. male with: Seasonal and perennial allergic rhinoconjunctivitis Past history - Worsening rhino conjunctivitis symptoms from spring through fall for the last few years. Skin testing 7 years ago was positive to trees, weed, grass, dogs, cats per patient report. No previous allergy immunotherapy. 2020 skin testing showed: Positive to grass, weed, trees, mold, dust mites. Interim history - stopped allegra and Singulair a few weeks ago with no flare in symptoms. Continue environmental control measures. May use over the counter antihistamines such as Zyrtec (cetirizine), Claritin (loratadine), Allegra (fexofenadine), or Xyzal (levocetirizine) daily as needed. May take twice a day during allergy flares. May switch antihistamines  every few months. Take daily antihistamine starting in the spring (end of February). Take Singulair (montelukast) 10mg  daily at night during the spring. May use mometasone nasal spray 1 spray per nostril 1-2 times a day as needed for nasal congestion. Nasal saline spray (i.e., Simply Saline) or nasal saline lavage (i.e., NeilMed) is recommended as needed and prior to medicated nasal sprays. May use Pazeo 1 drop in each eye daily as needed for itchy/watery eyes.   Reactive airway disease Past history - Shortness of breath and wheezing for the past 1 year mainly after spending significant time outdoors. Symptoms resolve the next day if takes additional antihistamine. No previous diagnosis of asthma but had albuterol as a child but stopped due to palpitations. 2020 spirometry showed: No overt abnormalities noted with no significant improvement in FEV1 post bronchodilator treatment. Trigger: humidity. Interim history - used once when very humid outdoors. May use levoalbuterol rescue inhaler 2 puffs as needed for shortness of breath, chest tightness, coughing, and wheezing. May use levoalbuterol rescue inhaler 2 puffs 5 to 15 minutes prior to strenuous physical activities. Monitor frequency of use.   Dermatitis Past history - TRUE patch test all negative. Interim history - some patches on the hands - flared after using cleaning products.  May use desonide 0.05% ointment twice a day as needed for mild eczema flares - okay to use on the face, neck, groin area. Do not use more than 1 week at a time. May use Eucrisa (crisaborole) 2% ointment twice a day on mild rash flares - okay to use on the face. This is a non-steroid ointment.  Take pictures if it flares. Continue proper skin care.  Return in about  9 months (around 05/14/2021).  No orders of the defined types were placed in this encounter.  Lab Orders  No laboratory test(s) ordered today    Diagnostics: None.  Medication List:  Current  Outpatient Medications  Medication Sig Dispense Refill   betamethasone dipropionate 0.05 % cream APPLY TO HANDS TWICE DAILY X 2-3 WEEKS, THEN AS NEEDED FOR FLARING/ITCHING.     EUCRISA 2 % OINT Apply 1 application topically 2 (two) times daily.     Olopatadine HCl 0.2 % SOLN Apply 1 drop to eye daily as needed (itchy/watery eyes). 2.5 mL 5   fexofenadine (ALLEGRA) 180 MG tablet Take 180 mg by mouth daily as needed for allergies or rhinitis. (Patient not taking: Reported on 08/13/2020)     levalbuterol (XOPENEX HFA) 45 MCG/ACT inhaler TAKE 2 PUFFS BY MOUTH EVERY 6 HOURS AS NEEDED FOR WHEEZE OR SHORTNESS OF BREATH (Patient not taking: Reported on 08/13/2020) 15 g 2   montelukast (SINGULAIR) 10 MG tablet Take 1 tablet (10 mg total) by mouth at bedtime. (Patient not taking: Reported on 08/13/2020) 30 tablet 5   No current facility-administered medications for this visit.   Allergies: Allergies  Allergen Reactions   Ciprofloxacin Palpitations   I reviewed his past medical history, social history, family history, and environmental history and no significant changes have been reported from his previous visit.  Review of Systems  Constitutional:  Negative for appetite change, chills, fever and unexpected weight change.  HENT:  Negative for congestion, rhinorrhea and sneezing.   Eyes:  Negative for itching.  Respiratory:  Negative for cough, chest tightness, shortness of breath and wheezing.   Cardiovascular:  Negative for chest pain.  Gastrointestinal:  Negative for abdominal pain.  Genitourinary:  Negative for difficulty urinating.  Skin:  Positive for rash.  Allergic/Immunologic: Positive for environmental allergies. Negative for food allergies.  Neurological:  Negative for headaches.   Objective: BP 102/70   Pulse 68   Temp 98 F (36.7 C) (Temporal)   Resp 16   SpO2 98%  There is no height or weight on file to calculate BMI. Physical Exam Vitals and nursing note reviewed.   Constitutional:      Appearance: Normal appearance. He is well-developed.  HENT:     Head: Normocephalic and atraumatic.     Right Ear: Tympanic membrane and external ear normal.     Left Ear: Tympanic membrane and external ear normal.     Nose: Nose normal.     Mouth/Throat:     Mouth: Mucous membranes are moist.     Pharynx: Oropharynx is clear.  Eyes:     Conjunctiva/sclera: Conjunctivae normal.  Cardiovascular:     Rate and Rhythm: Normal rate and regular rhythm.     Heart sounds: Normal heart sounds. No murmur heard.   No friction rub. No gallop.  Pulmonary:     Effort: Pulmonary effort is normal.     Breath sounds: Normal breath sounds. No wheezing, rhonchi or rales.  Musculoskeletal:     Cervical back: Neck supple.  Skin:    General: Skin is warm.     Findings: Rash present.     Comments: Few erythematous patches on the hands b/l.  Neurological:     Mental Status: He is alert and oriented to person, place, and time.  Psychiatric:        Behavior: Behavior normal.  Previous notes and tests were reviewed. The plan was reviewed with the patient/family, and all questions/concerned were addressed.  It  was my pleasure to see Dave Luna today and participate in his care. Please feel free to contact me with any questions or concerns.  Sincerely,  Wyline Mood, DO Allergy & Immunology  [facility] office: [phone] [facility] office: [phone]

## 2020-08-13 NOTE — Assessment & Plan Note (Signed)
Past history - TRUE patch test all negative. Interim history - some patches on the hands - flared after using cleaning products.   May use desonide 0.05% ointment twice a day as needed for mild eczema flares - okay to use on the face, neck, groin area. Do not use more than 1 week at a time.  May use Eucrisa (crisaborole) 2% ointment twice a day on mild rash flares - okay to use on the face. This is a non-steroid ointment.   Take pictures if it flares.  Continue proper skin care.

## 2020-08-13 NOTE — Assessment & Plan Note (Signed)
Past history - Worsening rhino conjunctivitis symptoms from spring through fall for the last few years. Skin testing 7 years ago was positive to trees, weed, grass, dogs, cats per patient report. No previous allergy immunotherapy. 2020 skin testing showed: Positive to grass, weed, trees, mold, dust mites. Interim history - stopped allegra and Singulair a few weeks ago with no flare in symptoms.  Continue environmental control measures.  May use over the counter antihistamines such as Zyrtec (cetirizine), Claritin (loratadine), Allegra (fexofenadine), or Xyzal (levocetirizine) daily as needed. May take twice a day during allergy flares. May switch antihistamines every few months. ? Take daily antihistamine starting in the spring (end of February).  Take Singulair (montelukast) 10mg  daily at night during the spring.  May use mometasone nasal spray 1 spray per nostril 1-2 times a day as needed for nasal congestion.  Nasal saline spray (i.e., Simply Saline) or nasal saline lavage (i.e., NeilMed) is recommended as needed and prior to medicated nasal sprays.  May use Pazeo 1 drop in each eye daily as needed for itchy/watery eyes.

## 2020-08-13 NOTE — Assessment & Plan Note (Signed)
Past history - Shortness of breath and wheezing for the past 1 year mainly after spending significant time outdoors. Symptoms resolve the next day if takes additional antihistamine. No previous diagnosis of asthma but had albuterol as a child but stopped due to palpitations. 2020 spirometry showed: No overt abnormalities noted with no significant improvement in FEV1 post bronchodilator treatment. Trigger: humidity. Interim history - used once when very humid outdoors.  May use levoalbuterol rescue inhaler 2 puffs as needed for shortness of breath, chest tightness, coughing, and wheezing. May use levoalbuterol rescue inhaler 2 puffs 5 to 15 minutes prior to strenuous physical activities. Monitor frequency of use.

## 2020-11-27 DIAGNOSIS — L2089 Other atopic dermatitis: Secondary | ICD-10-CM | POA: Diagnosis not present

## 2020-11-27 DIAGNOSIS — L218 Other seborrheic dermatitis: Secondary | ICD-10-CM | POA: Diagnosis not present

## 2021-05-21 NOTE — Progress Notes (Signed)
? ?Follow Up Note ? ?RE: Bon Reason MRN: [id number] DOB: 03-28-1980 ?Date of Office Visit: 05/22/2021 ? ?Referring provider: [place], Betty G, MD ?Primary care provider: [place], Betty G, MD ? ?Chief Complaint: Allergic Rhinitis  (Spring allergies has been better - new house with wood floors has helped a lot. ) and Asthma (No issues ) ? ?History of Present Illness: ?I had the pleasure of seeing Dave Luna for a follow up visit at the [facility]  on 05/22/2021. He is a 41 y.o. male, who is being followed for allergic rhinoconjunctivitis, reactive airway disease, dermatitis. His previous allergy office visit was on 08/13/2020 with Dr. Maudie Mercury. Today is a regular follow up visit. ? ?Seasonal and perennial allergic rhinoconjunctivitis ?Patient moved to a new home without any carpet and has noticed improvement in his symptoms indoors.  ?Currently taking antihistamine (Claritin and allegra) daily with good benefit. ?Symptoms flare when outdoors but then after he takes a shower and antihistamines by the next day he is better.  ?Using eye drops as needed with good benefit.  ?Content with current regimen. ?  ?Reactive airway disease ?Denies any ER/urgent care visits or prednisone use since the last visit. ?No recent levoalbuterol use. ?Going to [place] in May.  ? ?Dermatitis ?Doing better and has some seborrheic dermatitis on the scalp. ?Using ketoconazole shampoo as needed.  ? ?Sometimes flares on the hands and eyelids. ? ?Assessment and Plan: ?Dave Luna is a 41 y.o. male with: ?Seasonal and perennial allergic rhinoconjunctivitis ?Past history - Worsening rhino conjunctivitis symptoms from spring through fall for the last few years. Skin testing 7 years ago was positive to trees, weed, grass, dogs, cats per patient report. No previous allergy immunotherapy. 2020 skin testing showed: Positive to grass, weed, trees, mold, dust mites. ?Interim history - controlled with below regimen. ?Continue environmental control  measures. ?May use over the counter antihistamines such as Zyrtec (cetirizine), Claritin (loratadine), Allegra (fexofenadine), or Xyzal (levocetirizine) daily as needed. May take twice a day during allergy flares. May switch antihistamines every few months. ?Take daily antihistamine starting in the spring (end of February). ?May take Singulair (montelukast) 10mg  daily at night during the spring. ?May use mometasone nasal spray 1 spray per nostril 1-2 times a day as needed for nasal congestion. ?Nasal saline spray (i.e., Simply Saline) or nasal saline lavage (i.e., NeilMed) is recommended as needed and prior to medicated nasal sprays. ?May use Pazeo 1 drop in each eye daily as needed for itchy/watery eyes.  ? ?Reactive airway disease ?Past history - Shortness of breath and wheezing for the past 1 year mainly after spending significant time outdoors. Symptoms resolve the next day if takes additional antihistamine. No previous diagnosis of asthma but had albuterol as a child but stopped due to palpitations. 2020 spirometry showed: No overt abnormalities noted with no significant improvement in FEV1 post bronchodilator treatment. Trigger: humidity. ?Interim history - doing to [place] in May where it's humid.  ?Today's spirometry was normal.  ?May use levoalbuterol rescue inhaler 2 puffs as needed for shortness of breath, chest tightness, coughing, and wheezing. May use levoalbuterol rescue inhaler 2 puffs 5 to 15 minutes prior to strenuous physical activities. Monitor frequency of use.  ? ?Dermatitis ?Past history - TRUE patch test all negative. ?Interim history - diagnosed with seborrheic dermatitis on scalp by derm. Hand/eyelid sometimes flares but much better than before.  ?May use desonide 0.05% ointment twice a day as needed for mild eczema flares - okay to use on the  face, neck, groin area. Do not use more than 1 week at a time. ?May use Eucrisa (crisaborole) 2% ointment twice a day on mild rash flares - okay to use  on the face. This is a non-steroid ointment.  ?Take pictures if it flares. ?Continue proper skin care. ? ?Return in about 1 year (around 05/23/2022). ? ?Meds ordered this encounter  ?Medications  ? levalbuterol (XOPENEX HFA) 45 MCG/ACT inhaler  ?  Sig: Inhale 2 puffs into the lungs every 4 (four) hours as needed for wheezing or shortness of breath (coughing fits).  ?  Dispense:  1 each  ?  Refill:  1  ? ?Lab Orders  ?No laboratory test(s) ordered today  ? ? ?Diagnostics: ?Spirometry:  ?Tracings reviewed. His effort: Good reproducible efforts. ?FVC: 4.30L ?FEV1: 4.02L, 100% predicted ?FEV1/FVC ratio: 93% ?Interpretation: Spirometry consistent with normal pattern.  ?Please see scanned spirometry results for details. ? ?Medication List:  ?Current Outpatient Medications  ?Medication Sig Dispense Refill  ? betamethasone dipropionate 0.05 % cream APPLY TO HANDS TWICE DAILY X 2-3 WEEKS, THEN AS NEEDED FOR FLARING/ITCHING.    ? clobetasol (TEMOVATE) 0.05 % external solution Apply topically.    ? EUCRISA 2 % OINT Apply 1 application topically 2 (two) times daily.    ? fexofenadine (ALLEGRA) 180 MG tablet Take 180 mg by mouth daily as needed for allergies or rhinitis.    ? ketoconazole (NIZORAL) 2 % shampoo Apply topically.    ? levalbuterol (XOPENEX HFA) 45 MCG/ACT inhaler Inhale 2 puffs into the lungs every 4 (four) hours as needed for wheezing or shortness of breath (coughing fits). 1 each 1  ? montelukast (SINGULAIR) 10 MG tablet Take 1 tablet (10 mg total) by mouth at bedtime. 30 tablet 5  ? Olopatadine HCl 0.2 % SOLN Apply 1 drop to eye daily as needed (itchy/watery eyes). 2.5 mL 5  ? ?No current facility-administered medications for this visit.  ? ?Allergies: ?Allergies  ?Allergen Reactions  ? Ciprofloxacin Palpitations  ? ?I reviewed his past medical history, social history, family history, and environmental history and no significant changes have been reported from his previous visit. ? ?Review of Systems   ?Constitutional:  Negative for appetite change, chills, fever and unexpected weight change.  ?HENT:  Negative for congestion, rhinorrhea and sneezing.   ?Eyes:  Negative for itching.  ?Respiratory:  Negative for cough, chest tightness, shortness of breath and wheezing.   ?Cardiovascular:  Negative for chest pain.  ?Gastrointestinal:  Negative for abdominal pain.  ?Genitourinary:  Negative for difficulty urinating.  ?Skin:  Positive for rash.  ?Allergic/Immunologic: Positive for environmental allergies. Negative for food allergies.  ?Neurological:  Negative for headaches.  ? ?Objective: ?BP 122/82   Pulse 79   Temp 98.7 ?F (37.1 ?C)   Resp 18   Ht 5' 7.72" (1.72 m)   Wt 208 lb 12.8 oz (94.7 kg)   SpO2 97%   BMI 32.01 kg/m?  ?Body mass index is 32.01 kg/m?Marland Kitchen. ?Physical Exam ?Vitals and nursing note reviewed.  ?Constitutional:   ?   Appearance: Normal appearance. He is well-developed.  ?HENT:  ?   Head: Normocephalic and atraumatic.  ?   Right Ear: Tympanic membrane and external ear normal.  ?   Left Ear: Tympanic membrane and external ear normal.  ?   Nose: Nose normal.  ?   Mouth/Throat:  ?   Mouth: Mucous membranes are moist.  ?   Pharynx: Oropharynx is clear.  ?Eyes:  ?   Conjunctiva/sclera:  Conjunctivae normal.  ?Cardiovascular:  ?   Rate and Rhythm: Normal rate and regular rhythm.  ?   Heart sounds: Normal heart sounds. No murmur heard. ?  No friction rub. No gallop.  ?Pulmonary:  ?   Effort: Pulmonary effort is normal.  ?   Breath sounds: Normal breath sounds. No wheezing, rhonchi or rales.  ?Musculoskeletal:  ?   Cervical back: Neck supple.  ?Skin: ?   General: Skin is warm and dry.  ?   Findings: No rash.  ?   Comments: Dry patches on eyebrows and hands b/l.  ?Neurological:  ?   Mental Status: He is alert and oriented to person, place, and time.  ?Psychiatric:     ?   Behavior: Behavior normal.  ?Previous notes and tests were reviewed. ?The plan was reviewed with the patient/family, and all  questions/concerned were addressed. ? ?It was my pleasure to see Dave Luna today and participate in his care. Please feel free to contact me with any questions or concerns. ? ?Sincerely, ? ?Rexene Alberts, DO ?Allergy & Immunology ? ?[facility]

## 2021-05-22 ENCOUNTER — Encounter: Payer: Self-pay | Admitting: Allergy

## 2021-05-22 ENCOUNTER — Ambulatory Visit (INDEPENDENT_AMBULATORY_CARE_PROVIDER_SITE_OTHER): Payer: BC Managed Care – PPO | Admitting: Allergy

## 2021-05-22 VITALS — BP 122/82 | HR 79 | Temp 98.7°F | Resp 18 | Ht 67.72 in | Wt 208.8 lb

## 2021-05-22 DIAGNOSIS — L309 Dermatitis, unspecified: Secondary | ICD-10-CM

## 2021-05-22 DIAGNOSIS — H1013 Acute atopic conjunctivitis, bilateral: Secondary | ICD-10-CM | POA: Diagnosis not present

## 2021-05-22 DIAGNOSIS — H1010 Acute atopic conjunctivitis, unspecified eye: Secondary | ICD-10-CM

## 2021-05-22 DIAGNOSIS — J302 Other seasonal allergic rhinitis: Secondary | ICD-10-CM

## 2021-05-22 DIAGNOSIS — J4520 Mild intermittent asthma, uncomplicated: Secondary | ICD-10-CM | POA: Diagnosis not present

## 2021-05-22 MED ORDER — LEVALBUTEROL TARTRATE 45 MCG/ACT IN AERO: 2.0000 | INHALATION_SPRAY | RESPIRATORY_TRACT | 1 refills | Status: AC | PRN

## 2021-05-22 NOTE — Assessment & Plan Note (Signed)
Past history - Shortness of breath and wheezing for the past 1 year mainly after spending significant time outdoors. Symptoms resolve the next day if takes additional antihistamine. No previous diagnosis of asthma but had albuterol as a child but stopped due to palpitations. 2020 spirometry showed: No overt abnormalities noted with no significant improvement in FEV1 post bronchodilator treatment. Trigger: humidity. ?Interim history - doing to [place] in May where it's humid.  ?? Today's spirometry was normal.  ?? May use levoalbuterol rescue inhaler 2 puffs as needed for shortness of breath, chest tightness, coughing, and wheezing. May use levoalbuterol rescue inhaler 2 puffs 5 to 15 minutes prior to strenuous physical activities. Monitor frequency of use.  ?

## 2021-05-22 NOTE — Patient Instructions (Addendum)
Rash ?May use desonide 0.05% ointment twice a day as needed for mild eczema flares - okay to use on the face, neck, groin area. Do not use more than 1 week at a time. ?May use Eucrisa (crisaborole) 2% ointment twice a day on mild rash flares - okay to use on the face. This is a non-steroid ointment.  ?Take pictures if it flares. ?Continue proper skin care. ? ?Reactive airway disease ?May use levoalbuterol rescue inhaler 2 puffs as needed for shortness of breath, chest tightness, coughing, and wheezing. May use levoalbuterol rescue inhaler 2 puffs 5 to 15 minutes prior to strenuous physical activities. Monitor frequency of use.  ?  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 ?Continue environmental control measures. ?May use over the counter antihistamines such as Zyrtec (cetirizine), Claritin (loratadine), Allegra (fexofenadine), or Xyzal (levocetirizine) daily as needed. May take twice a day during allergy flares. May switch antihistamines every few months. ?Take daily antihistamine starting in the spring (end of February). ?May take Singulair (montelukast) 10mg  daily at night during the spring. ?May use mometasone nasal spray 1 spray per nostril 1-2 times a day as needed for nasal congestion. ?Nasal saline spray (i.e., Simply Saline) or nasal saline lavage (i.e., NeilMed) is recommended as needed and prior to medicated nasal sprays. ?May use Pazeo 1 drop in each eye daily as needed for itchy/watery eyes.  ? ?Follow up in 1 year or sooner if needed. ? ?Reducing Pollen Exposure ?Pollen seasons: trees (spring), grass (summer) and ragweed/weeds (fall). ?Keep windows closed in your home and car to lower pollen exposure.  ?Install air conditioning in the bedroom and throughout the house if possible.  ?Avoid going out in dry windy days - especially early morning. ?Pollen counts are highest between 5 - 10 AM and on dry, hot and windy days.  ?Save outside  activities for late afternoon or after a heavy rain, when pollen levels are lower.  ?Avoid mowing of grass if you have grass pollen allergy. ?Be aware that pollen can also be transported indoors on people and pets.  ?Dry your clothes in an automatic dryer rather than hanging them outside where they might collect pollen.  ?Rinse hair and eyes before bedtime. ? ?Skin care recommendations ? ?Bath time: ?Always use lukewarm water. AVOID very hot or cold water. ?Keep bathing time to 5-10 minutes. ?Do NOT use bubble bath. ?Use a mild soap and use just enough to wash the dirty areas. ?Do NOT scrub skin vigorously.  ?After bathing, pat dry your skin with a towel. Do NOT rub or scrub the skin. ? ?Moisturizers and prescriptions:  ?ALWAYS apply moisturizers immediately after bathing (within 3 minutes). This helps to lock-in moisture. ?Use the moisturizer several times a day over the whole body. ?Good summer moisturizers include: Aveeno, CeraVe, Cetaphil. ?Good winter moisturizers include: Aquaphor, Vaseline, Cerave, Cetaphil, Eucerin, Vanicream. ?When using moisturizers along with medications, the moisturizer should be applied about one hour after applying the medication to prevent diluting effect of the medication or moisturize around where you applied the medications. When not using medications, the moisturizer can be continued twice daily as maintenance. ? ?Laundry and clothing: ?Avoid laundry products with added color or perfumes. ?Use unscented hypo-allergenic laundry products such as Tide free, Cheer free & gentle, and All free and clear.  ?If the skin still seems dry or sensitive, you can try double-rinsing the clothes. ?Avoid tight or scratchy clothing such as wool. ?Do not use fabric  softeners or dyer sheets. ?

## 2021-05-22 NOTE — Assessment & Plan Note (Signed)
Past history - TRUE patch test all negative. ?Interim history - diagnosed with seborrheic dermatitis on scalp by derm. Hand/eyelid sometimes flares but much better than before.  ?? May use desonide 0.05% ointment twice a day as needed for mild eczema flares - okay to use on the face, neck, groin area. Do not use more than 1 week at a time. ?? May use Eucrisa (crisaborole) 2% ointment twice a day on mild rash flares - okay to use on the face. This is a non-steroid ointment.  ?? Take pictures if it flares. ?? Continue proper skin care. ?

## 2021-05-22 NOTE — Assessment & Plan Note (Signed)
Past history - Worsening rhino conjunctivitis symptoms from spring through fall for the last few years. Skin testing 7 years ago was positive to trees, weed, grass, dogs, cats per patient report. No previous allergy immunotherapy. 2020 skin testing showed: Positive to grass, weed, trees, mold, dust mites. ?Interim history - controlled with below regimen. ?? Continue environmental control measures. ?? May use over the counter antihistamines such as Zyrtec (cetirizine), Claritin (loratadine), Allegra (fexofenadine), or Xyzal (levocetirizine) daily as needed. May take twice a day during allergy flares. May switch antihistamines every few months. ?? Take daily antihistamine starting in the spring (end of February). ?? May take Singulair (montelukast) 10mg  daily at night during the spring. ?? May use mometasone nasal spray 1 spray per nostril 1-2 times a day as needed for nasal congestion. ?? Nasal saline spray (i.e., Simply Saline) or nasal saline lavage (i.e., NeilMed) is recommended as needed and prior to medicated nasal sprays. ?? May use Pazeo 1 drop in each eye daily as needed for itchy/watery eyes.  ?

## 2021-05-27 DIAGNOSIS — L2089 Other atopic dermatitis: Secondary | ICD-10-CM | POA: Diagnosis not present

## 2021-05-27 DIAGNOSIS — L218 Other seborrheic dermatitis: Secondary | ICD-10-CM | POA: Diagnosis not present

## 2021-09-16 DIAGNOSIS — L218 Other seborrheic dermatitis: Secondary | ICD-10-CM | POA: Diagnosis not present
